# Patient Record
Sex: Female | Born: 1970 | Hispanic: Yes | Marital: Married | Smoking: Never smoker
Health system: Southern US, Community
[De-identification: ages and names within clinical notes are randomized; demographics above are authoritative.]

## PROBLEM LIST (undated history)

## (undated) DIAGNOSIS — E079 Disorder of thyroid, unspecified: Secondary | ICD-10-CM

## (undated) DIAGNOSIS — F32A Depression, unspecified: Secondary | ICD-10-CM

## (undated) DIAGNOSIS — E559 Vitamin D deficiency, unspecified: Secondary | ICD-10-CM

## (undated) DIAGNOSIS — K5900 Constipation, unspecified: Secondary | ICD-10-CM

## (undated) DIAGNOSIS — F329 Major depressive disorder, single episode, unspecified: Secondary | ICD-10-CM

## (undated) DIAGNOSIS — M549 Dorsalgia, unspecified: Secondary | ICD-10-CM

## (undated) DIAGNOSIS — F419 Anxiety disorder, unspecified: Secondary | ICD-10-CM

## (undated) HISTORY — DX: Disorder of thyroid, unspecified: E07.9

## (undated) HISTORY — PX: APPENDECTOMY: SHX54

## (undated) HISTORY — DX: Vitamin D deficiency, unspecified: E55.9

## (undated) HISTORY — PX: TUBAL LIGATION: SHX77

## (undated) HISTORY — DX: Constipation, unspecified: K59.00

## (undated) HISTORY — DX: Anxiety disorder, unspecified: F41.9

## (undated) HISTORY — DX: Major depressive disorder, single episode, unspecified: F32.9

## (undated) HISTORY — DX: Depression, unspecified: F32.A

## (undated) HISTORY — DX: Dorsalgia, unspecified: M54.9

---

## 2006-08-28 ENCOUNTER — Emergency Department (HOSPITAL_COMMUNITY): Admission: EM | Admit: 2006-08-28 | Discharge: 2006-08-28 | Payer: Self-pay | Admitting: Emergency Medicine

## 2007-09-11 ENCOUNTER — Encounter: Admission: RE | Admit: 2007-09-11 | Discharge: 2007-09-11 | Payer: Self-pay | Admitting: Internal Medicine

## 2007-09-15 ENCOUNTER — Encounter: Admission: RE | Admit: 2007-09-15 | Discharge: 2007-09-15 | Payer: Self-pay | Admitting: Internal Medicine

## 2009-11-07 ENCOUNTER — Other Ambulatory Visit: Admission: RE | Admit: 2009-11-07 | Discharge: 2009-11-07 | Payer: Self-pay | Admitting: Gynecology

## 2009-11-07 ENCOUNTER — Ambulatory Visit: Payer: Self-pay | Admitting: Gynecology

## 2009-11-11 ENCOUNTER — Ambulatory Visit: Payer: Self-pay | Admitting: Gynecology

## 2010-11-11 ENCOUNTER — Encounter: Payer: Self-pay | Admitting: Gynecology

## 2010-11-19 ENCOUNTER — Other Ambulatory Visit: Payer: Self-pay | Admitting: Gynecology

## 2010-11-19 ENCOUNTER — Other Ambulatory Visit (HOSPITAL_COMMUNITY)
Admission: RE | Admit: 2010-11-19 | Discharge: 2010-11-19 | Disposition: A | Discharge status: Home / Self Care | Payer: BC Managed Care – PPO | Source: Ambulatory Visit | Attending: Gynecology | Admitting: Gynecology

## 2010-11-19 ENCOUNTER — Encounter (INDEPENDENT_AMBULATORY_CARE_PROVIDER_SITE_OTHER): Payer: BC Managed Care – PPO | Admitting: Gynecology

## 2010-11-19 DIAGNOSIS — Z124 Encounter for screening for malignant neoplasm of cervix: Secondary | ICD-10-CM | POA: Insufficient documentation

## 2010-11-19 DIAGNOSIS — Z833 Family history of diabetes mellitus: Secondary | ICD-10-CM

## 2010-11-19 DIAGNOSIS — R823 Hemoglobinuria: Secondary | ICD-10-CM

## 2010-11-19 DIAGNOSIS — R635 Abnormal weight gain: Secondary | ICD-10-CM

## 2010-11-19 DIAGNOSIS — Z01419 Encounter for gynecological examination (general) (routine) without abnormal findings: Secondary | ICD-10-CM

## 2011-06-23 ENCOUNTER — Other Ambulatory Visit: Payer: Self-pay | Admitting: Gynecology

## 2011-06-23 DIAGNOSIS — Z1231 Encounter for screening mammogram for malignant neoplasm of breast: Secondary | ICD-10-CM

## 2011-06-30 ENCOUNTER — Ambulatory Visit (HOSPITAL_COMMUNITY)
Admission: RE | Admit: 2011-06-30 | Discharge: 2011-06-30 | Disposition: A | Discharge status: Home / Self Care | Payer: BC Managed Care – PPO | Source: Ambulatory Visit | Attending: Gynecology | Admitting: Gynecology

## 2011-06-30 DIAGNOSIS — Z1231 Encounter for screening mammogram for malignant neoplasm of breast: Secondary | ICD-10-CM | POA: Insufficient documentation

## 2011-08-31 HISTORY — PX: COMBINED AUGMENTATION MAMMAPLASTY AND ABDOMINOPLASTY: SUR291

## 2011-08-31 HISTORY — PX: AUGMENTATION MAMMAPLASTY: SUR837

## 2011-08-31 HISTORY — PX: LIPOSUCTION TRUNK: SUR833

## 2011-11-22 ENCOUNTER — Encounter: Payer: BC Managed Care – PPO | Admitting: Gynecology

## 2011-11-24 ENCOUNTER — Ambulatory Visit (INDEPENDENT_AMBULATORY_CARE_PROVIDER_SITE_OTHER): Payer: BC Managed Care – PPO | Admitting: Gynecology

## 2011-11-24 ENCOUNTER — Encounter: Payer: Self-pay | Admitting: Gynecology

## 2011-11-24 VITALS — BP 122/70

## 2011-11-24 DIAGNOSIS — Z01419 Encounter for gynecological examination (general) (routine) without abnormal findings: Secondary | ICD-10-CM

## 2011-11-24 DIAGNOSIS — R635 Abnormal weight gain: Secondary | ICD-10-CM

## 2011-11-24 DIAGNOSIS — G4700 Insomnia, unspecified: Secondary | ICD-10-CM

## 2011-11-24 LAB — TSH: TSH: 4.523 u[IU]/mL — ABNORMAL HIGH (ref 0.350–4.500)

## 2011-11-24 LAB — GLUCOSE, RANDOM: Glucose, Bld: 93 mg/dL (ref 70–99)

## 2011-11-24 LAB — CHOLESTEROL, TOTAL: Cholesterol: 175 mg/dL (ref 0–200)

## 2011-11-24 MED ORDER — ZOLPIDEM TARTRATE 10 MG PO TABS: 10.0000 mg | ORAL_TABLET | Freq: Every evening | ORAL | Status: DC | PRN

## 2011-11-24 NOTE — Progress Notes (Signed)
Denise Byrd [birth year] [id number]   History:    41 y.o.  for annual exam who was last seen the office in 2011. She has complaining of increased weight she's now weighing 170 whereby she was weighing last year 167. Patient concern about an indurated area on her right breast. Patient December 2012 had breast augmentation and abdominoplasty in [place]. Patient had a normal mammogram October 2012 before the procedure. Patient with prior tubal sterilization procedure. Patient having normal menstrual cycles. Patient complaining of occasional vaginal dryness and decreased libido as well as insomnia. Last year she was referred to a psychiatrist and did not follow the recommendation would like to followup with 1 this year. Past medical history,surgical history, family history and social history were all reviewed and documented in the EPIC chart.  Gynecologic History Patient's last menstrual period was 11/12/2011. Contraception: tubal ligation Last Pap: 2012. Results were: normal Last mammogram: 2012. Results were:normal}  Obstetric History OB History    Grav Para Term Preterm Abortions TAB SAB Ect Mult Living   3 3 3       3      # Outc Date GA Lbr Len/2nd Wgt Sex Del Anes PTL Lv   1 TRM     F SVD  No Yes   2 TRM     M SVD  No Yes   3 TRM     M SVD  No Yes       ROS:  Was performed and pertinent positives and negatives are included in the history.  Exam: chaperone present  BP 122/70  LMP 11/12/2011  There is no height or weight on file to calculate BMI.  General appearance : Well developed well nourished female. No acute distress HEENT: Neck supple, trachea midline, no carotid bruits, no thyroidmegaly Lungs: Clear to auscultation, no rhonchi or wheezes, or rib retractions  Heart: Regular rate and rhythm, no murmurs or gallops Breast:Examined in sitting and supine position evidence on both breasts of augmentation scars present. There was no supraclavicular or axillary lymphadenopathy  on either breast. On the right breast and the areolar tissue itself there was an indurated area extending from the 9:00 to the 3:00 position.   Abdomen: no palpable masses or tenderness, no rebound or guarding Extremities: no edema or skin discoloration or tenderness  Pelvic:  Bartholin, Urethra, Skene Glands: Within normal limits             Vagina: No gross lesions or discharge  Cervix: No gross lesions or discharge  Uterus  anteverted, normal size, shape and consistency, non-tender and mobile  Adnexa  Without masses or tenderness  Anus and perineum  normal   Rectovaginal  normal sphincter tone without palpated masses or tenderness             Hemoccult not done     Assessment/Plan:  41 y.o. female for annual exam with an indurated area on the right breast areolar tissue from the 9:00 to the 3:00 position for which she will be sent for diagnostic mammogram. Patient will be given the name and number of a psychiatrist in the community for her to followup with personal issues anxiety depression that she is experiencing. To help with her sleep she was prescribed Ambien to take 1 by mouth each bedtime when necessary the following labs will be ordered today: Random blood sugar, TSH, CBC, and urinalysis. New Pap smear screening guidelines discussed. No Pap smear will be done today since she had a normal 1 2012.  Ok Edwards MD, 11:01 AM 11/24/2011

## 2011-11-24 NOTE — Patient Instructions (Signed)
Te llamaremos esta semana con cita para mamografia

## 2011-11-25 ENCOUNTER — Telehealth: Payer: Self-pay | Admitting: *Deleted

## 2011-11-25 ENCOUNTER — Other Ambulatory Visit: Payer: Self-pay | Admitting: *Deleted

## 2011-11-25 DIAGNOSIS — N6459 Other signs and symptoms in breast: Secondary | ICD-10-CM

## 2011-11-25 LAB — URINALYSIS W MICROSCOPIC + REFLEX CULTURE
Bilirubin Urine: NEGATIVE
Casts: NONE SEEN
Crystals: NONE SEEN
Glucose, UA: NEGATIVE mg/dL
Ketones, ur: NEGATIVE mg/dL
Leukocytes, UA: NEGATIVE
Nitrite: NEGATIVE
Protein, ur: NEGATIVE mg/dL
RBC / HPF: NONE SEEN RBC/hpf (ref <3)
Specific Gravity, Urine: 1.005 (ref 1.005–1.030)
Squamous Epithelial / LPF: NONE SEEN
Urobilinogen, UA: 0.2 mg/dL (ref 0.0–1.0)
WBC, UA: NONE SEEN WBC/hpf (ref <3)
pH: 6 (ref 5.0–8.0)

## 2011-11-25 LAB — CBC WITH DIFFERENTIAL/PLATELET
Basophils Absolute: 0 10*3/uL (ref 0.0–0.1)
Basophils Relative: 0 % (ref 0–1)
Eosinophils Absolute: 0.1 10*3/uL (ref 0.0–0.7)
Eosinophils Relative: 1 % (ref 0–5)
HCT: 42.3 % (ref 36.0–46.0)
Hemoglobin: 14.2 g/dL (ref 12.0–15.0)
Lymphocytes Relative: 22 % (ref 12–46)
Lymphs Abs: 1.6 10*3/uL (ref 0.7–4.0)
MCH: 29.8 pg (ref 26.0–34.0)
MCHC: 33.6 g/dL (ref 30.0–36.0)
MCV: 88.9 fL (ref 78.0–100.0)
Monocytes Absolute: 0.6 10*3/uL (ref 0.1–1.0)
Monocytes Relative: 9 % (ref 3–12)
Neutro Abs: 4.8 10*3/uL (ref 1.7–7.7)
Neutrophils Relative %: 68 % (ref 43–77)
Platelets: 260 10*3/uL (ref 150–400)
RBC: 4.76 MIL/uL (ref 3.87–5.11)
RDW: 13 % (ref 11.5–15.5)
WBC: 7.1 10*3/uL (ref 4.0–10.5)

## 2011-11-25 NOTE — Telephone Encounter (Signed)
Message copied by Mckinley Jewel, Dolores Ewing L on Thu Nov 25, 2011 10:14 AM ------      Message from: Ok Edwards      Created: Wed Nov 24, 2011 11:07 AM       Please schedule diagnostic mammogram with possible ultrasound of right breast on this patient with prior breast augmentation December 2012 who has an indurated area in the areolar tissue itself from the 9:00 to the 3:00 position. Last mammogram at the breast center. Thank you

## 2011-11-25 NOTE — Telephone Encounter (Signed)
Lm for patient to call.  Have appt set at [facility] on 12/01/11 @ 1:20pm.

## 2011-11-29 ENCOUNTER — Telehealth: Payer: Self-pay | Admitting: *Deleted

## 2011-11-29 ENCOUNTER — Other Ambulatory Visit: Payer: Self-pay | Admitting: *Deleted

## 2011-11-29 DIAGNOSIS — R7989 Other specified abnormal findings of blood chemistry: Secondary | ICD-10-CM

## 2011-11-29 NOTE — Telephone Encounter (Signed)
Message copied by Libby Maw on Mon Nov 29, 2011  3:56 PM ------      Message from: Ok Edwards      Created: Thu Nov 25, 2011  8:51 AM       Delenn Ahn, I just want to make sure that this patient was been scheduled for diagnostic mammogram of the right breast. Patient with indurated areolar tissue from the 9 to 3:00 position. Also please inform her that her TSH was slightly elevated I would like to repeat a full thyroid panel next week. Thank you

## 2011-11-29 NOTE — Telephone Encounter (Signed)
Patient informed appt set up with [facility] on 12/01/11 @ 1:20pm.  Also informed patient needs to get TSH panel in the office.  Order in pc and patient will call back to set up lab appt.

## 2011-12-01 ENCOUNTER — Ambulatory Visit
Admission: RE | Admit: 2011-12-01 | Discharge: 2011-12-01 | Disposition: A | Discharge status: Home / Self Care | Payer: BC Managed Care – PPO | Source: Ambulatory Visit | Attending: Gynecology | Admitting: Gynecology

## 2011-12-01 ENCOUNTER — Other Ambulatory Visit: Payer: Self-pay | Admitting: Gynecology

## 2011-12-01 DIAGNOSIS — N6459 Other signs and symptoms in breast: Secondary | ICD-10-CM

## 2011-12-02 ENCOUNTER — Other Ambulatory Visit: Payer: BC Managed Care – PPO

## 2011-12-02 DIAGNOSIS — R7989 Other specified abnormal findings of blood chemistry: Secondary | ICD-10-CM

## 2011-12-03 ENCOUNTER — Other Ambulatory Visit: Payer: Self-pay | Admitting: *Deleted

## 2011-12-03 DIAGNOSIS — R7989 Other specified abnormal findings of blood chemistry: Secondary | ICD-10-CM

## 2011-12-03 LAB — THYROID PANEL WITH TSH
Free Thyroxine Index: 3 (ref 1.0–3.9)
T3 Uptake: 38 % — ABNORMAL HIGH (ref 22.5–37.0)
T4, Total: 7.8 ug/dL (ref 5.0–12.5)
TSH: 2.123 u[IU]/mL (ref 0.350–4.500)

## 2012-06-05 ENCOUNTER — Other Ambulatory Visit: Payer: Self-pay | Admitting: Gynecology

## 2012-06-05 DIAGNOSIS — Z1231 Encounter for screening mammogram for malignant neoplasm of breast: Secondary | ICD-10-CM

## 2012-06-07 ENCOUNTER — Other Ambulatory Visit: Payer: BC Managed Care – PPO

## 2012-06-07 DIAGNOSIS — R7989 Other specified abnormal findings of blood chemistry: Secondary | ICD-10-CM

## 2012-06-08 LAB — THYROID PANEL WITH TSH
Free Thyroxine Index: 3 (ref 1.0–3.9)
T3 Uptake: 36.9 % (ref 22.5–37.0)
T4, Total: 8 ug/dL (ref 5.0–12.5)
TSH: 2.924 u[IU]/mL (ref 0.350–4.500)

## 2012-07-04 ENCOUNTER — Ambulatory Visit
Admission: RE | Admit: 2012-07-04 | Discharge: 2012-07-04 | Disposition: A | Discharge status: Home / Self Care | Payer: BC Managed Care – PPO | Source: Ambulatory Visit | Attending: Gynecology | Admitting: Gynecology

## 2012-07-04 ENCOUNTER — Other Ambulatory Visit: Payer: Self-pay | Admitting: Gynecology

## 2012-07-04 DIAGNOSIS — Z1231 Encounter for screening mammogram for malignant neoplasm of breast: Secondary | ICD-10-CM

## 2012-12-06 ENCOUNTER — Ambulatory Visit (INDEPENDENT_AMBULATORY_CARE_PROVIDER_SITE_OTHER): Payer: BC Managed Care – PPO | Admitting: Gynecology

## 2012-12-06 ENCOUNTER — Encounter: Payer: Self-pay | Admitting: Gynecology

## 2012-12-06 ENCOUNTER — Telehealth: Payer: Self-pay | Admitting: *Deleted

## 2012-12-06 ENCOUNTER — Ambulatory Visit (INDEPENDENT_AMBULATORY_CARE_PROVIDER_SITE_OTHER): Payer: BC Managed Care – PPO

## 2012-12-06 VITALS — BP 116/70 | Ht 64.0 in | Wt 163.0 lb

## 2012-12-06 DIAGNOSIS — Z23 Encounter for immunization: Secondary | ICD-10-CM

## 2012-12-06 DIAGNOSIS — N949 Unspecified condition associated with female genital organs and menstrual cycle: Secondary | ICD-10-CM

## 2012-12-06 DIAGNOSIS — R102 Pelvic and perineal pain: Secondary | ICD-10-CM

## 2012-12-06 DIAGNOSIS — N63 Unspecified lump in unspecified breast: Secondary | ICD-10-CM

## 2012-12-06 DIAGNOSIS — Z01419 Encounter for gynecological examination (general) (routine) without abnormal findings: Secondary | ICD-10-CM

## 2012-12-06 DIAGNOSIS — N831 Corpus luteum cyst of ovary, unspecified side: Secondary | ICD-10-CM

## 2012-12-06 DIAGNOSIS — N83209 Unspecified ovarian cyst, unspecified side: Secondary | ICD-10-CM

## 2012-12-06 DIAGNOSIS — N83202 Unspecified ovarian cyst, left side: Secondary | ICD-10-CM | POA: Insufficient documentation

## 2012-12-06 DIAGNOSIS — N839 Noninflammatory disorder of ovary, fallopian tube and broad ligament, unspecified: Secondary | ICD-10-CM

## 2012-12-06 DIAGNOSIS — R635 Abnormal weight gain: Secondary | ICD-10-CM | POA: Insufficient documentation

## 2012-12-06 DIAGNOSIS — N6310 Unspecified lump in the right breast, unspecified quadrant: Secondary | ICD-10-CM | POA: Insufficient documentation

## 2012-12-06 LAB — CBC WITH DIFFERENTIAL/PLATELET
Basophils Absolute: 0 10*3/uL (ref 0.0–0.1)
Basophils Relative: 0 % (ref 0–1)
Eosinophils Absolute: 0 10*3/uL (ref 0.0–0.7)
Eosinophils Relative: 0 % (ref 0–5)
HCT: 38.9 % (ref 36.0–46.0)
Hemoglobin: 13.5 g/dL (ref 12.0–15.0)
Lymphocytes Relative: 30 % (ref 12–46)
Lymphs Abs: 1.8 10*3/uL (ref 0.7–4.0)
MCH: 30.1 pg (ref 26.0–34.0)
MCHC: 34.7 g/dL (ref 30.0–36.0)
MCV: 86.6 fL (ref 78.0–100.0)
Monocytes Absolute: 0.4 10*3/uL (ref 0.1–1.0)
Monocytes Relative: 6 % (ref 3–12)
Neutro Abs: 3.9 10*3/uL (ref 1.7–7.7)
Neutrophils Relative %: 64 % (ref 43–77)
Platelets: 217 10*3/uL (ref 150–400)
RBC: 4.49 MIL/uL (ref 3.87–5.11)
RDW: 13.4 % (ref 11.5–15.5)
WBC: 6.1 10*3/uL (ref 4.0–10.5)

## 2012-12-06 LAB — HEMOGLOBIN A1C
Hgb A1c MFr Bld: 5.4 % (ref <5.7)
Mean Plasma Glucose: 108 mg/dL (ref <117)

## 2012-12-06 LAB — CHOLESTEROL, TOTAL: Cholesterol: 151 mg/dL (ref 0–200)

## 2012-12-06 LAB — TSH: TSH: 3.128 u[IU]/mL (ref 0.350–4.500)

## 2012-12-06 MED ORDER — OXYCODONE-ACETAMINOPHEN 10-325 MG PO TABS: 1.0000 | ORAL_TABLET | ORAL | Status: DC | PRN

## 2012-12-06 NOTE — Telephone Encounter (Signed)
Orders placed for the below note. 

## 2012-12-06 NOTE — Telephone Encounter (Signed)
Message copied by Aura Camps on Wed Dec 06, 2012  3:43 PM ------      Message from: Ok Edwards      Created: Wed Dec 06, 2012 11:32 AM       Victorino Dike this patient needs to be scheduled at the breast center for a diagnostic mammogram of the right breast with possible ultrasound and screening mammogram of right breast.            Patient with prior breast augmentation 1 year ago in [place] (saline implants). Now with a 2-3 cm irregular mass at the 12 o,clock position periareolar region of right breast. No axillary or supraclavicular lymphadenopathy. ------

## 2012-12-06 NOTE — Progress Notes (Signed)
Denise Byrd Sep 11, 1971 [id number]   History:    42 y.o.  for annual gyn exam presented to the office today complaining of right lower quadrant discomfort on and off for past week. She stated last night her pain was so intense that she'll most went to the emergency room. Patient with prior tubal sterilization procedure. Patient is having normal menstrual cycle. Last menstrual cycle February 1-4 and was normal. Patient with prior history of breast augmentation and abdominoplasty. Patient has complained at times on and off of constipation when she strained recently and wiped she gets all a slight streak of blood. Review of patient's records indicated that her last mammogram last year she had dense breasts but was normal. Patient mother has history of thyroid cancer. Patient denies any prior history of abnormal Pap smears.  Past medical history,surgical history, family history and social history were all reviewed and documented in the EPIC chart.  Gynecologic History Patient's last menstrual period was 11/11/2012. Contraception: tubal ligation Last Pap: 2012. Results were: normal Last mammogram: 2013. Results were: normal  Obstetric History OB History   Grav Para Term Preterm Abortions TAB SAB Ect Mult Living   3 3 3       3      # Outc Date GA Lbr Len/2nd Wgt Sex Del Anes PTL Lv   1 TRM     F SVD  No Yes   2 TRM     M SVD  No Yes   3 TRM     M SVD  No Yes       ROS: A ROS was performed and pertinent positives and negatives are included in the history.  GENERAL: No fevers or chills. HEENT: No change in vision, no earache, sore throat or sinus congestion. NECK: No pain or stiffness. CARDIOVASCULAR: No chest pain or pressure. No palpitations. PULMONARY: No shortness of breath, cough or wheeze. GASTROINTESTINAL: No abdominal pain, nausea, vomiting or diarrhea, melena or bright red blood per rectum. GENITOURINARY: No urinary frequency, urgency, hesitancy or dysuria. MUSCULOSKELETAL: No joint or  muscle pain, no back pain, no recent trauma. DERMATOLOGIC: No rash, no itching, no lesions. ENDOCRINE: No polyuria, polydipsia, no heat or cold intolerance. No recent change in weight. HEMATOLOGICAL: No anemia or easy bruising or bleeding. NEUROLOGIC: No headache, seizures, numbness, tingling or weakness. PSYCHIATRIC: No depression, no loss of interest in normal activity or change in sleep pattern.     Exam: chaperone present  BP 116/70  Ht 5\' 4"  (1.626 m)  Wt 163 lb (73.936 kg)  BMI 27.97 kg/m2  LMP 11/11/2012  Body mass index is 27.97 kg/(m^2).  General appearance : Well developed well nourished female. No acute distress HEENT: Neck supple, trachea midline, no carotid bruits, no thyroidmegaly Lungs: Clear to auscultation, no rhonchi or wheezes, or rib retractions  Heart: Regular rate and rhythm, no murmurs or gallops Breast exam:a 2-3 cm irregular mass at the 12 o,clock position periareolar region of right breast. No axillary or supraclavicular lymphadenopathy. Extremities: no edema or skin discoloration or tenderness  Pelvic:  Bartholin, Urethra, Skene Glands: Within normal limits             Vagina: No gross lesions or discharge  Cervix: No gross lesions or discharge  Uterus  anteverted, normal size, shape and consistency, non-tender and mobile  Adnexa  Without masses or tenderness  Anus and perineum  normal   Rectovaginal  normal sphincter tone without palpated masses or tenderness  Hemoccult not indicated   Ultrasound today: Uterus measures 9.1 x 5.5 x 4.2 cm with endometrial stripe of 9.3 mm. Right ovary was normal. Left ovary thin-walled cyst with positive color flow in the periphery. Internal low level echoes were noted. Cyst measured 2.9 x 1.7 x 1.6 and meters and no fluid in the cul-de-sac.   Assessment/Plan:  42 y.o. female for annual exam with incidental finding of a 2-3 cm irregular mass at the 12 o,clock position periareolar region of right breast. No  axillary or supraclavicular lymphadenopathy. Patient with prior history of breast implants  one year ago in [place]. Patient will be scheduled for diagnostic mammogram and ultrasound of the right breast with a screening mammogram of the left breast. The following labs were also order: CBC, hemoglobin A1c, TSH, and urinalysis. No Pap smear done today the new screening guidelines discussed. Patient with a small apparent hemorrhagic cyst for which will prescribe Percocet to take one by mouth every 4-6 hours when necessary. She will return to the office in 3 months for followup ultrasound. We will hold off on any hormone manipulation of the cyst until we have complete evaluation of her right breast mass.    Ok Edwards MD, 12:38 PM 12/06/2012

## 2012-12-06 NOTE — Patient Instructions (Addendum)
Vacuna difteria/ttanos (Td) o [place] difteria, ttanos, tos convulsa (Tdap), Lo que debe saber (Tetanus, Diphtheria [Td] or Tetanus, Diphtheria, Pertussis [Tdap] Vaccine, What You Need to Know) PORQU VACUNARSE? El ttanos , la difteria y la tos ferina pueden ser enfermedades graves.  El TTANOS  (trismo) provoca la contraccin dolorosa y rigidez de los msculos, por lo general, en todo el cuerpo.   Puede causar la contraccin de los msculos de la cabeza y el cuello de modo que el enfermo no puede abrir la boca ni tragar., y en algunos casos, tampoco puede respirar.. El ttanos causa la muerte de 1 de cada 5 personas que se infectan. LA DIFTERIA produce la formacin de una membrana gruesa que cubre el fondo de la garganta.  Puede causar problemas respiratorios, parlisis, insuficiencia cardaca, e incluso la muerte. El PERTUSIS (tos [place]) causa ataques de tos intensa que pueden dificultar la respiracin, provocar vmitos e interrumpir el sueo.   Puede causar prdida de peso, incontinencia, fractura de [place], y desmayos por la intensa tos. Hasta de 2 de cada 100 adolescentes y 5 de cada 100 adultos que enferman de tos [place] deben ser hospitalizados o tienen complicaciones como la neumona y la [place]. Estas 3 enfermedades son provocadas por bacterias. La difteria y la tos Benetta Spar se [place] de persona a Social worker. El ttanos ingresa al organismo a travs de cortes, rasguos o heridas. En los [place] ocurran alrededor de 200 000 casos por ao de difteria y tos [place], antes de que existieran las [place], y tambin ocurran cientos de casos de ttanos. Desde la aparicin de las vacunas, el ttanos y la difteria han disminuido en alrededor del 99% y los casos de tos ferina disminuyeron aproximadamente el 92%.  Los nios menores de 6 aos deben recibir la vacuna DTaP para estar protegidos contra estas tres enfermedades. Pero los [employer], los adolescentes y los adultos tambin  necesitan proteccin. VACUNAS PARA ADOLESCENTES Y ADULTOS Vacunas Tdap y Td  Hay dos vacunas disponibles para proteger de estas enfermedades a nios a Glass blower/designer de los 7aos:   La vacuna Td fue utilizada durante muchos aos. Protege contra el ttanos y la difteria.  La vacuna Tdap fue autorizada en 2005. Es la primera vacuna para adolescentes y adultos que protege contra la tos ferina y el ttanos y la difteria. Una dosis de refuerzo de la Td se recomienda cada 10 aos. La Tdap se aplica slo una vez.  QU VACUNA DEBO APLICARME Y CUANDO? Las edades de 7 a 18 aos  Dynegy 11 y los 12 aos se recomienda una dosis de Tdap. Esta dosis puede aplicarse desde los 7 aos en los nios que no han recibido una o ms dosis de DTaP anteriormente.  Los nios y adolescentes que no recibieron todas las dosis programadas de DTaP o DTP a los 7 aos deben completar la serie usando una combinacin de Td y Tdap. Adultos de 19 aos o ms  [employer] adultos deben recibir una dosis de refuerzo de Td cada 10 aos. Los adultos de menos de 65 aos que nunca hayan recibido la Tdap deben reemplazarla por la siguiente dosis de refuerzo. Los adultos a partir de los 65 aos puedenrecibir una dosis de Tdap.  Los adultos (incluyendo las mujeres que podran quedar embarazadas y los adultos mayores de 65 aos) que tienen contacto cercano con un beb menor de 12 meses deben aplicarse una dosis de Tdap para proteger al beb de la tos [place].  Los trabajadores de  la salud que tengan contacto directo con pacientes en hospitales o clnicas deben recibir una dosis de Tdap. Proteccin despus de [place] herida  Es posible que una persona que tenga un corte o quemadura grave necesite una dosis de Td o Tdap para prevenir la infeccin por ttanos. Puede usarse la Tdap en personas que nunca recibieron una dosis. Pero debe usarse la Td, si la Tdap no se encuentra disponible, o para:  Cualquier persona que haya recibido una dosis de  Tdap.  Los nios [employer] 7 y los 9 aos que han C.H. Robinson Worldwide series de DTap anteriormente.  Adultos de 65 aos o ms. Mujeres embarazadas.   Las mujeres embarazadas que nunca recibieron una dosis de Ddap deben recibirla despus de la 20a semana de gestacin y preferiblemente durante Contractor. trimestre. Si no se aplican la Tdap durante el embarazo, deben recibirla lo antes posible despus del parto. Las mujeres embarazadas que han recibido la Tdap y tienen que aplicarse la vacuna contra el ttanos o la difteria durante el [place], deben recibir la Td. Las vacunas Tdap y Td pueden ser administradas al mismo tiempo que otras vacunas. ALGUNAS PERSONAS NO DEBEN RECIBIR LA VACUNA O DEBEN [employer]ard  Las personas que hayan tenido una reaccin alrgica que haya puesto en peligro su vida despus de una dosis de vacuna contra el ttanos, la difteria o la tos ferina no deben recibir Td ni Tdap..  Las personas que tengan alergias graves a algn componente de una vacuna no deben recibir esa vacuna. Informe a su mdico si la persona que recibe la vacuna sufre alergias graves.  Cualquier persona que American Standard Companies en coma o que haya tenido convulsiones dentro de los 7 [address]  [address] posteriores despus de una dosis de DTP o DTaP no debe recibir la Tdap, salvo que se encuentre una causa que no fuera la vacuna. Estas personas pueden recibir Td.  Consulte a su mdico si la persona que recibe [place] de las vacunas:  Tiene epilepsia o algn otro problema del sistema nervioso.  Tuvo inflamacin o dolor intenso despus de una dosis de DTP, DTaP, DT, Td, o Tdap.  Ha tenido el sndrome de Scientific laboratory technician (GBS por sus siglas en ingls). Las personas que sufran una enfermedad moderada o grave el da en que se programa la vacuna, deben esperar a recuperarse para recibir las vacunas Tdap o Td. Por lo general, una persona con una enfermedad leve o fiebre baja puede recibir la vacuna. CULES SON LOS RIESGOS DE LAS VACUNAS TDAP Y  TD? Con Cathleen Corti, al igual que con cualquier Automatic Data, siempre existe un pequeo riesgo de una reaccin alrgica que ponga en peligro la vida o cause otro problema grave. Todo procedimiento mdico, inclusive la vacunacin pueden causar breves episodios de lipotimia o sntomas relacionados (como movimientos espasmdicos). Para evitar los [employer] y las lesiones causadas por las cadas, permanezca sentado o recustese durante los 15 minutos posteriores a la vacunacin. Informe a su mdico si el paciente se siente dbil o mareado, tiene cambios en la visin o siente zumbidos en los odos.  Es mucho ms probable que tener ttanos, difteria, o tos ferina cause problemas ms graves que los provocados por recibir cualquiera de las vacunas Td o Tdap. A continuacin se enumeran los problemas informados despus de las vacunas Td y Tdap. Problemas Leves (perceptibles, pero que no interfirieron con las actividades): Tdap  Dolor (alrededor de 3 de cada 4 adolescentes y 2 de cada 3 adultos).  Enrojecimiento o inflamacin en  el sitio de la inyeccin (alrededor de 1 de cada 5).  Fiebre leve de al menos 100.4 F (38 C) (hasta alrededor de 1 cada 25 adolescentes y 1 de cada 100 adultos).  Dolor de cabeza (alrededor de 4 de cada 10 adolescentes y 3 de cada 10 adultos).  Cansancio (alrededor de 1 de cada 3 adolescentes y 1 de cada 4 adultos).  Nuseas, vmitos, diarrea, o dolor de estmago (hasta 1 de cada 4 adolescentes y 1 de cada 10 adultos).  Escalofros, dolores corporales, dolor articular, erupciones, o inflamacin de las glndulas (poco frecuente). Td  Dolor (hasta alrededor de 8 de cada 10).  Enrojecimiento o inflamacin de la inyeccin (alrededor de 1 de cada 3).  Fiebre leve (hasta alrededor de 1 de cada 5).  Dolor de cabeza o cansancio (poco frecuente). Problemas Moderados (interfieren con las [place], [place] no requieren atencin mdica): Tdap  Dolor en el sitio de la inyeccin  (alrededor de 1 de cada 20 adolescentes y 1 de cada 100 adultos).  Enrojecimiento o inflamacin de la inyeccin (alrededor de 1 de cada 16 adolescentes y 1 de cada 25 adultos).  Fiebre de ms de 102 F (38.9 C) (alrededor de 1 de cada 100 adolescentes y 1 de cada 250 adultos).  Dolor de cabeza (1 de cada 300).  Nuseas, vmitos, diarrea, o dolor de estmago (hasta 3 de cada 100 adolescentes y 1 de cada 100 adultos). Td  Fiebre de ms de 102 F (38.9 C) (poco comn). Tdap o Td  Inflamacin de gran extensin en el brazo en el que se aplic la vacuna (hasta 3 de cada 100). Problemas [place] (no puede realizar [employer]; requiere Psychologist, prison and probation services) Tdap o Td  Inflamacin, dolor intenso, sangrado y enrojecimiento en el brazo, en el sitio de la inyeccin (poco frecuente). Puede producirse una reaccin alrgica grave despus de cualquier vacuna. Se estima que estas reacciones ocurren en menos de una de cada un milln de dosis. QU PASA SI HAY UNA REACCIN GRAVE? Qu signos debo buscar? Cualquier estado poco habitual, como una reaccin alrgica grave o fiebre alta. Si le produce Runner, broadcasting/film/video grave, se manifestar dentro de algunos minutos a una hora despus de recibir la vacuna. Entre los signos de Automotive engineer grave se encuentran la dificultad para respirar, debilidad, ronquera o sibilancias, latidos cardacos acelerados, urticaria, mareos, palidez, o inflamacin de la garganta. Qu debo hacer?  Comunquese con su mdico o lleve inmediatamente a la persona al mdico.  Dgale a su mdico qu ocurri, la fecha y hora en que sucedi y cundo le aplicaron la vacuna.  Pida a su mdico que informe sobre la reaccin llenando un formulario del Sistema de Informacin de Reacciones Adversos a las Administrator, arts (VAERS, por sus siglas en ingls). O, puede presentar este informe a travs del sitio web de VAERS [email or web address] o puede llamar al [phone]. VAERS no brinda  asistencia mdica. PROGRAMA NACIONAL DE COMPENSACIN DE DAOS POR VACUNAS El [place] de Compensacin de Daos por Vacunas (VICP) fue creado en 1986.  Aquellas personas que consideren que han sufrido un dao como consecuencia de una vacuna y quieren saber ms acerca del programa y como presentar Roslynn Amble, [place] llamar al [phone] o visitar su sitio web en [email or web address]  CMO Roxan Diesel MS INFORMACIN?  El profesional podr darle el prospecto de la vacuna o sugerirle otras fuentes de informacin.  Comunquese con el servicio de salud de su localidad o [address].  Comunquese con los Centros para el  control y la prevencin de Child psychotherapist for Disease Control and Prevention , CDC).  Llame al [phone] (1-800-CDC-INFO).  Visite los sitios web de Energy Transfer Partners en PicCapture.uy CDC Td and Tdap Interim VIS-Spanish (10/06/10) Document Released: 12/16/2008 Document Revised: 11/22/2011 [facility] Patient Information 2013 [place], [place].    Ejercicios para perder peso (Exercise to Lose Weight) La actividad fsica y Neomia Dear dieta saludable ayudan a perder peso. El mdico podr sugerirle ejercicios especficos. IDEAS Y CONSEJOS PARA HACER EJERCICIOS  Elija opciones econmicas que disfrute hacer , como caminar, andar en bicicleta o los vdeos para ejercitarse.   Utilice las Microbiologist del ascensor.   Camine durante la hora del almuerzo.   Estacione el auto lejos del lugar de [place] o [place].   Concurra a un gimnasio o tome clases de gimnasia.   Comience con 5  10 minutos de actividad fsica por da. Ejercite hasta 30 minutos, 4 a 6 das por [address]   Utilice zapatos que tengan un buen soporte y ropas cmodas.   Elongue antes y despus de Company secretary.   Ejercite hasta que aumente la respiracin y el corazn palpite rpido.   Beba agua extra cuando ejercite.   No haga ejercicio Firefighter, sentirse mareado o que le  falte mucho el aire.  La actividad fsica puede quemar alrededor de 150 caloras.  Correr 20 cuadras en 15 minutos.   Jugar vley durante 45 a 60 minutos.   Limpiar y encerar el auto durante 45 a 60 minutos.   Jugar ftbol americano de toque.   Caminar 25 cuadras en 35 minutos.   Empujar un cochecito 20 cuadras en 30 minutos.   Jugar baloncesto durante 30 minutos.   Rastrillar hojas secas durante 30 minutos.   Andar en bicicleta 80 cuadras en 30 minutos.   Caminar 30 cuadras en 30 minutos.   Bailar durante 30 minutos.   Quitar la nieve con una pala durante 15 minutos.   Nadar vigorosamente durante 20 minutos.   Subir escaleras durante 15 minutos.   Andar en bicicleta 60 cuadras durante 15 minutos.   Arreglar el jardn entre 30 y 45 minutos.   Saltar a la soga durante 15 minutos.   Limpiar vidrios o pisos durante 45 a 60 minutos.  Document Released: 12/04/2010 Document Revised: 05/12/2011 [facility] Patient Information 2012 [place], [place].                                                  Control del colesterol  Los niveles de colesterol en el organismo estn determinados significativamente por su dieta. Los niveles de colesterol tambin se relacionan con la enfermedad cardaca. El material que sigue ayuda a Software engineer relacin y a Chiropractor qu puede hacer para mantener su corazn sano. No todo el colesterol es [place]. Las lipoprotenas de baja densidad (LDL) forman el colesterol "malo". El colesterol malo puede ocasionar depsitos de grasa que se acumulan en el interior de las arterias. Las lipoprotenas de alta densidad (HDL) es el colesterol "bueno". Ayuda a remover el colesterol LDL "malo" de la [place]. El colesterol es un factor de riesgo muy importante para la enfermedad cardaca. Otros factores de riesgo son la hipertensin arterial, el hbito de fumar, el estrs, la herencia y [place].   El msculo cardaco obtiene el suministro de sangre a travs de las arterias  coronarias. Si  su colesterol LDL ("malo") est elevado y el HDL ("bueno") es bajo, tiene un factor de riesgo para que se formen depsitos de Holiday representative en las arterias coronarias (los vasos sanguneos que suministran sangre al corazn). Esto hace que haya menos lugar para que la sangre circule. Sin la suficiente sangre y oxgeno, el msculo cardaco no puede funcionar correctamente, y usted podr sentir dolores en el pecho (angina pectoris). Cuando una arteria coronaria se cierra completamente, una parte del msculo cardaco puede morir (infarto de miocardio).  CONTROL DEL COLESTEROL Cuando el profesional que lo asiste enva la sangre al laboratorio para Artist nivel de colesterol, puede realizarle tambin un perfil completo de los lpidos. Con esta prueba, se puede determinar la cantidad total de colesterol, as como los niveles de LDL y HDL. Los triglicridos son un tipo de grasa que circula en la sangre y que tambin puede utilizarse para determinar el riesgo de enfermedad cardaca. En la siguiente tabla se establecen los nmeros ideales: Prueba: Colesterol total  Menos de 200 mg/dl.  Prueba: LDL "colesterol malo"  Menos de 100 mg/dl.   Menos de 70 mg/dl si tiene riesgo muy elevado de sufrir un ataque cardaco o muerte cardaca sbita.  Prueba: HDL "colesterol bueno"  Mujeres: Ms de 50 mg/dl.   Hombres: Ms de 40 mg/dl.  Prueba: Trigliceridos  Menos de 150 mg/dl.    CONTROL DEL COLESTEROL CON DIETA Aunque factores como el ejercicio y el estilo de vida son importantes, la "primera lnea de ataque" es la dieta. Esto se debe a que se sabe que ciertos alimentos hacen subir el colesterol y otros lo [place]. El objetivo debe ser [employer] Foods alimentos, de modo que tengan un efecto sobre el colesterol y, an ms importante, Microbiologist las grasas saturadas y trans con otros tipos de grasas, como las monoinsaturadas y las poliinsaturadas y cidos grasos omega-3 . En promedio, una persona no debe  consumir ms de 15 a 17 g de grasas saturadas por C.H. [employer] Worldwide. Las grasas saturadas y trans se consideran grasas "malas", ya que elevan el colesterol LDL. Las grasas saturadas se encuentran principalmente en productos animales como carne, Akins y crema. Pero esto no significa que usted Marketing executive todas sus comidas favoritas. Actualmente, como lo muestra el cuadro que figura al final de este documento, hay sustitutos de buen sabor, bajos en grasas y en colesterol, para la mayora de los alimentos que a usted Musician. Elija aquellos alimentos alternativos que sean bajos en grasas o sin grasas. Elija cortes de carne del cuarto trasero o lomo ya que estos cortes son los que tienen menor cantidad de grasa y Oncologist. El pollo (sin piel), el pescado, la carne de ternera, y la [place] molida son excelentes opciones. Elimine las carnes Tyson Foods o el salami. Los Federal[employer]Mogul o nada de grasas saturadas. Cuando consuma carne [place], carne de aves de corral, o pescado, hgalo en porciones de 85 gramos (3 onzas). Las grasas trans tambin se llaman "aceites parcialmente hidrogenados". Son aceites manipulados cientficamente de [place] que son slidos a Publishing rights manager, tienen una larga vida y Glass blower/designer sabor y la textura de los alimentos a los que se Scientist, clinical (histocompatibility and immunogenetics). Las grasas trans se encuentran en la [place], [place], crackers y alimentos horneados.  Para hornear y cocinar, el aceite es un excelente sustituto para la [place]. Los aceites monoinsaturados tienen un beneficio particular, ya que se cree que disminuyen el colesterol LDL (colesterol malo) y elevan el HDL. Deber evitar  los aceites tropicales saturados como el de coco y el de [place].  Recuerde, adems, que puede comer sin restricciones los grupos de alimentos que son naturalmente libres de grasas saturadas y Neurosurgeon trans, entre los que se incluyen el pescado, las frutas (excepto el [place]), verduras, frijoles, cereales  (cebada, arroz, [place], trigo) y las pastas (sin salsas con crema)   IDENTIFIQUE LOS ALIMENTOS QUE DISMINUYEN EL COLESTEROL  Pueden disminuir el colesterol las fibras solubles que estn en las frutas, como las [place], en los vegetales como el brcoli, las patatas y las zanahorias; en las legumbres como frijoles, guisantes y Therapist, occupational; y en los cereales como la cebada. Los alimentos fortificados con fitosteroles tambin Engineer, production. Debe consumir al menos 2 g de estos alimentos a diario para Financial planner de disminucin de [place].  En el supermercado, lea las etiquetas de los envases para identificar los alimentos bajos en grasas saturadas, libres de grasas trans y bajos en [place], . Elija quesos que tengan solo de 2 a 3 g de grasa saturada por onza (28,35 g). Use una margarina que no dae el corazn, [place] de grasas trans o aceite parcialmente hidrogenado. Al comprar alimentos horneados (galletitas dulces y Gaffer) evite el aceite parcialmente hidrogenado. Los panes y bollos debern ser de granos enteros (harina de maz o de avena entera, en lugar de "harina" o "harina enriquecida"). Compre sopas en lata que no sean cremosas, con bajo contenido de sal y sin grasas adicionadas.   TCNICAS DE PREPARACIN DE LOS ALIMENTOS  Nunca fra los alimentos en aceite abundante. Si debe frer, hgalo en poco aceite y removiendo [place], porque as se utilizan muy pocas grasas, o utilice un spray antiadherente. Cuando le sea posible, hierva, hornee o ase las carnes y cocine los vegetales al vapor. En vez de Aetna con mantequilla o [place], utilice limn y hierbas, pur de Psychologist, educational y canela (para las calabazas y batatas), yogurt y salsa descremados y aderezos para ensaladas bajos en contenido graso.   BAJO EN GRASAS SATURADAS / SUSTITUTOS BAJOS EN GRASA  Carnes / Grasas saturadas (g)  Evite: Bife, corte graso (3 oz/85 g) / 11 g   Elija: Bife, corte magro (3 oz/85  g) / 4 g   Evite: Hamburguesa (3 oz/85 g) / 7 g   Elija:  [place]a magra (3 oz/85 g) / 5 g   Evite: Jamn (3 oz/85 g) / 6 g   Elija:  Jamn magro (3 oz/85 g) / 2.4 g   Evite: Pollo, con piel (3 oz/85 g), Carne oscura / 4 g   Elija:  Pollo, sin piel (3 oz/85 g), Carne oscura / 2 g   Evite: Pollo, con piel (3 oz/85 g), Carne magra / 2.5 g   Elija: Pollo, sin piel (3 oz/85 g), Carne magra / 1 g  Lcteos / Grasas saturadas (g)  Evite: Leche entera (1 taza) / 5 g   Elija: Leche con bajo contenido de grasa, 2% (1 taza) / 3 g   Elija: Leche con bajo contenido de grasa, 1% (1 taza) / 1.5 g   Elija: Leche descremada (1 taza) / 0.3 g   Evite: Queso duro (1 oz/28 g) / 6 g   Elija: Queso descremado (1 oz/28 g) / 2-3 g   Evite: Queso cottage, 4% grasa (1 taza)/ 6.5 g   Elija: Queso cottage con bajo contenido de grasa, 1% grasa (1 taza)/ 1.5 g   Evite: Helado (1 taza) / 9 g  Elija: Sorbete (1 taza) / 2.5 g   Elija: Yogurt helado sin contenido de grasa (1 taza) / 0.3 g   Elija: Barras de fruta congeladas / vestigios   Evite: Crema batida (1 cucharada) / 3.5 g   Elija: Batidos glac sin lcteos (1 cucharada) / 1 g  Condimentos / Grasas saturadas (g)  Evite: Mayonesa (1 cucharada) / 2 g   Elija: Mayonesa con bajo contenido de grasa (1 cucharada) / 1 g   Evite: [place] (1 cucharada) / 7 g   Elija: Margarina extra light (1 cucharada) / 1 g   Evite: Aceite de coco (1 cucharada) / 11.8 g   Elija: Aceite de oliva (1 cucharada) / 1.8 g   Elija: Aceite de maz (1 cucharada) / 1.7 g   Elija: Aceite de crtamo (1 cucharada) / 1.2 g   Elija: Aceite de girasol (1 cucharada) / 1.4 g   Elija: Aceite de soja (1 cucharada) / 2.4 g   Elija: Aceite de canola (1 cucharada) / 1 g  Document Released: 08/30/2005 Document Revised: 05/12/2011 [facility] Patient Information 2012 [place], [place].  Quiste ovrico (Ovarian Cyst) Los ovarios son pequeos rganos que se encuentran a  cada lado del tero. Los ovarios son los rganos que producen las hormonas femeninas, estrgeno y Education officer, museum. Un quiste en el ovario es una bolsa llena de lquido que puede variar en tamao. Es normal que se formen pequeos quistes en las mujeres en edad de procrear y que an tienen sus perodos Designer, jewellery. Este tipo de quiste se denomina quiste folicular que se transforma en un quiste ovulatorio (quiste del cuerpo lteo) despus de producir los vulos. Si la mujer no queda embarazada, desaparece sin ninguna intervencin. Existen otros tipos de quistes de ovario que pueden causar problemas y necesitan ser tratados. El problema ms grave es que el quiste sea canceroso. Debe advertirse que en las mujeres menopusicas que presentan un quiste de ovario, existe un mayor riesgo de que ese quiste sea canceroso. Deben evaluarse muy rpida y [place], y IT sales professional. Esto es ms importante en las mujeres menopusicas debido al elevado porcentaje de cncer de ovario durante este perodo. CAUSAS Y TIPOS DE CNCER DE OVARIO:  QUISTE FUNCIONAL: El quiste de folculo o cuerpo lteo es un quiste funcional que aparece todos los meses durante la ovulacin, con el ciclo menstrual. Si la mujer no queda embarazada, desaparecen con el prximo ciclo menstrual. Generalmente los quistes funcionales no presentan sntomas.  ENDOMETRIOMA: este quiste aparece en la superficie del tejido del tero. Un quiste se forma en el interior o [employer]. Cada mes se desarrolla un poco ms debido a la sangre del perodo menstrual. Tambin se denomina "quiste de chocolate" debido a que est lleno de sangre que se vuelve color marrn. Este tipo de quiste causa dolor en la zona inferior del abdomen durante las relaciones sexuales y durante el perodo menstrual.  CISTADENOMA: Se desarrolla a partir de las clulas externas del ovario. Generalmente no son cancerosos. Pueden llegar a ser de gran tamao y causar dolor en la zona  baja del abdomen y [employer] sexuales. Este tipo de quiste puede retorcerse e interrumpir el flujo de Export, lo que causa un dolor muy intenso. Tambin puede romperse y Horticulturist, commercial.  QUISTE DERMOIDE: generalmente este tipo de quiste aparece en ambos ovarios. Puede haber diferentes tipos de tejidos en el quiste. Por ejemplo tejidos de piel, dientes, pelos o cartlago. En general no dan sntomas, excepto que sean muy  grandes. Los quistes dermoides rara vez son cancerosos.  OVARIO POLIQUSTICO: es una enfermedad rara relacionada con trastornos hormonales que produce muchos quistes pequeos en ambos ovarios. Estos quistes son similares a los quistes de folculo pero nunca producen vulos y se transforman en cuerpo lteo. Pueden causar aumento del [employer], infertilidad, acn, aumento del vello facial y corporal y falta de perodos menstruales o perodos anormales. Muchas mujeres que sufren este problema presentan diabetes tipo 2. La causa exacta de este problema es desconocida. Un ovario poliqustico rara vez es canceroso.  QUISTE OVRICO TECALUTESTICO Aparece cuando hay demasiada hormona (gonadotrofina corinica humana), la que sobreestimula al ovario para producir vulos. Se observan con frecuencia cuando el mdico estimula los ovarios para la fertilizacin in vitro (bebs de probeta).  QUISTE LUTENICO: Aparece durante el embarazo. En algunos casos raros, produce una obstruccin del canal de parto. Generalmente desaparece despus del parto. SNTOMAS  Dolor o molestias en la pelvis.  Dolor durante las [employer].  Aumento de la inflamacin en el abdomen.  Perodos menstruales anormales.  Aumento del [employer] perodos [place].  Deja de menstruar y no est embarazada. DIAGNSTICO El diagnstico puede realizarse durante:  Los exmenes plvicos anuales o de rutina (frecuente).  Ecografas  Radiografas de la pelvis.  Tomografa  computada  Resonancia magntica..  Anlisis de sangre. TRATAMIENTO  El tratamiento slo consiste en que el mdico controle el quiste Rena Lara, durante 2  3 meses. Muchos desaparecen espontnemente, especialmente los quistes funcionales.  Puede aspirarse (secarse) con Marella Bile larga observndolo en una ecografa, o por laparoscopa (insertando un tubo en la pelvis a travs de una pequea incisin).  El quiste puede extirparse con laparoscopa.  En algunos casos es necesario extirparlo a travs de una incisin en la zona inferior del abdomen.  El tratamiento hormonal se utiliza para Restaurant manager, fast food ciertos tipos de [place].  Las pldoras anticonceptivas pueden utilizarse para Restaurant manager, fast food otros tipos. INSTRUCCIONES PARA EL CUIDADO DOMICILIARIO Siga las indicaciones del profesional con respecto a:  Medicamentos  Visitas de control para evaluar y Pharmacologist.  Puede ser necesario que tenga que volver o concertar una cita con otro profesional para descubrir la causa exacta del quiste, si su mdico no es Research scientist (physical sciences).  Realice un examen plvico y un Papanicolau todos los aos, segn las indicaciones.  Informe al mdico si tubo un quiste de ovario en el pasado. SOLICITE ATENCIN MDICA SI:  Los perodos se atrasan, son irregulares, le faltan o son dolorosos.  El dolor abdominal (en el vientre) o en la pelvis persisten.  El abdomen se agranda o se hincha.  Siente una opresin en la vejiga o tiene problemas para vaciarla completamente.  Tiene dolor durante las [employer].  Tiene la sensacin de hinchazn, presin o molestias en el abdomen.  Pierde peso sin razn aparente.  Siente un Engineer, maintenance (IT).  Est constipada.  Pierde el apetito.  Aparece acn.  Aumenta el vello facial y Personal assistant.  Lenora Boys de peso sin hacer modificaciones en su actividad fsica y en su dieta habitual.  Sospecha que est embarazada. SOLICITE ATENCIN MDICA DE INMEDIATO  SI:  Siente dolor abdominal cada vez ms intenso.  Si tiene ganas de vomitar (nuseas).  Le sube repentinamente la fiebre.  Siente dolor abdominal al mover el intestino.  Sus perodos menstruales son ms abundantes que lo habitual. Document Released: 06/09/2005 Document Revised: 11/22/2011 [facility] Patient Information 2013 [place], [place].

## 2012-12-07 LAB — URINALYSIS W MICROSCOPIC + REFLEX CULTURE
Bacteria, UA: NONE SEEN
Bilirubin Urine: NEGATIVE
Casts: NONE SEEN
Crystals: NONE SEEN
Glucose, UA: NEGATIVE mg/dL
Hgb urine dipstick: NEGATIVE
Ketones, ur: NEGATIVE mg/dL
Leukocytes, UA: NEGATIVE
Nitrite: NEGATIVE
Protein, ur: NEGATIVE mg/dL
Specific Gravity, Urine: 1.008 (ref 1.005–1.030)
Squamous Epithelial / LPF: NONE SEEN
Urobilinogen, UA: 0.2 mg/dL (ref 0.0–1.0)
pH: 6.5 (ref 5.0–8.0)

## 2012-12-12 ENCOUNTER — Telehealth: Payer: Self-pay | Admitting: *Deleted

## 2012-12-12 NOTE — Telephone Encounter (Signed)
Message copied by Aura Camps on Tue Dec 12, 2012  4:02 PM ------      Message from: Ok Edwards      Created: Wed Dec 06, 2012 11:32 AM       Victorino Dike this patient needs to be scheduled at the breast center for a diagnostic mammogram of the right breast with possible ultrasound and screening mammogram of right breast.            Patient with prior breast augmentation 1 year ago in [place] (saline implants). Now with a 2-3 cm irregular mass at the 12 o,clock position periareolar region of right breast. No axillary or supraclavicular lymphadenopathy. ------

## 2012-12-12 NOTE — Telephone Encounter (Signed)
appt. 12/22/12 @ 8:20 am

## 2012-12-22 ENCOUNTER — Ambulatory Visit
Admission: RE | Admit: 2012-12-22 | Discharge: 2012-12-22 | Disposition: A | Discharge status: Home / Self Care | Payer: BC Managed Care – PPO | Source: Ambulatory Visit | Attending: Gynecology | Admitting: Gynecology

## 2012-12-22 DIAGNOSIS — N63 Unspecified lump in unspecified breast: Secondary | ICD-10-CM

## 2013-01-11 ENCOUNTER — Encounter: Payer: Self-pay | Admitting: Gynecology

## 2013-01-11 ENCOUNTER — Ambulatory Visit (INDEPENDENT_AMBULATORY_CARE_PROVIDER_SITE_OTHER): Payer: BC Managed Care – PPO | Admitting: Gynecology

## 2013-01-11 VITALS — BP 110/68

## 2013-01-11 DIAGNOSIS — R102 Pelvic and perineal pain: Secondary | ICD-10-CM

## 2013-01-11 DIAGNOSIS — N940 Mittelschmerz: Secondary | ICD-10-CM

## 2013-01-11 DIAGNOSIS — N949 Unspecified condition associated with female genital organs and menstrual cycle: Secondary | ICD-10-CM

## 2013-01-11 LAB — URINALYSIS W MICROSCOPIC + REFLEX CULTURE
Bilirubin Urine: NEGATIVE
Casts: NONE SEEN
Crystals: NONE SEEN
Glucose, UA: NEGATIVE mg/dL
Ketones, ur: NEGATIVE mg/dL
Leukocytes, UA: NEGATIVE
Nitrite: NEGATIVE
Specific Gravity, Urine: 1.02 (ref 1.005–1.030)
Urobilinogen, UA: 2 mg/dL — ABNORMAL HIGH (ref 0.0–1.0)
WBC, UA: NONE SEEN WBC/hpf (ref <3)
pH: 7 (ref 5.0–8.0)

## 2013-01-11 MED ORDER — KETOROLAC TROMETHAMINE 10 MG PO TABS: 10.0000 mg | ORAL_TABLET | Freq: Four times a day (QID) | ORAL | Status: DC | PRN

## 2013-01-11 MED ORDER — KETOROLAC TROMETHAMINE 30 MG/ML IJ SOLN
30.0000 mg | Freq: Once | INTRAMUSCULAR | Status: AC
  Administered 2013-01-11: 30 mg via INTRAMUSCULAR

## 2013-01-11 NOTE — Progress Notes (Addendum)
Patient presented to the office today complaining today history of left lower quadrant pain. She stated that on and off she's had some dyspareunia and not  more frequently. Her cycles were typically regular. She's had a previous tubal sterilization procedure.  Review of her record indicated she was seen in the office on March 26 had been complaining of right lower quadrant discomfort near the start of her menses just like in the cycle and had an ultrasound with the following result:  Uterus measures 9.1 x 5.5 x 4.2 cm with endometrial stripe of 9.3 mm. Right ovary was normal. Left ovary thin-walled cyst with positive color flow in the periphery. Internal low level echoes were noted. Cyst measured 2.9 x 1.7 x 1.6 and meters and no fluid in the cul-de-sac.   It was thought that it was a hemorrhagic cyst and she was instructed to return back in 3 months for followup ultrasound to return today with discomfort in the contralateral side.  Exam: Bartholin urethra Skene was within normal limits Vagina: Menstrual blood present (day 2 of patient's menstrual cycle) cervix: No lesions or discharge Uterus: Anteverted normal size shape and consistency Right adnexa no palpable mass or tenderness Left adnexa tenderness although I cannot discern a typical mass there was some mild guarding. Rectal exam: Not done  Assessment/plan: Patient with apparent mittelschmerz based on her cyclical and timing of her pain now on opposite side that in March. She did have a small ovarian cyst on the left. She will return back to the office next week after her menses and will followup with an ultrasound. She was given Toradol 30 mg IM today and will be prescribed Toradol 10 mg by mouth every 6 hours when necessary for the next 5 days. We discussed that after the ultrasound she may be a good candidate for low dose oral contraceptive pill for her dysmenorrhea and for ovarian cyst suppression. Patient stated before she had her tubal  sterilization procedure she had been on the oral contraceptive pills and never had any problem.

## 2013-01-11 NOTE — Addendum Note (Signed)
Addended by: Bertram Savin A on: 01/11/2013 03:50 PM   Modules accepted: Orders

## 2013-01-11 NOTE — Addendum Note (Signed)
Addended by: Bertram Savin A on: 01/11/2013 03:48 PM   Modules accepted: Orders

## 2013-01-31 ENCOUNTER — Ambulatory Visit (INDEPENDENT_AMBULATORY_CARE_PROVIDER_SITE_OTHER): Payer: BC Managed Care – PPO | Admitting: Gynecology

## 2013-01-31 ENCOUNTER — Ambulatory Visit (INDEPENDENT_AMBULATORY_CARE_PROVIDER_SITE_OTHER): Payer: BC Managed Care – PPO

## 2013-01-31 ENCOUNTER — Encounter: Payer: Self-pay | Admitting: Gynecology

## 2013-01-31 VITALS — BP 124/78

## 2013-01-31 DIAGNOSIS — R102 Pelvic and perineal pain: Secondary | ICD-10-CM

## 2013-01-31 DIAGNOSIS — N831 Corpus luteum cyst of ovary, unspecified side: Secondary | ICD-10-CM

## 2013-01-31 DIAGNOSIS — N83209 Unspecified ovarian cyst, unspecified side: Secondary | ICD-10-CM

## 2013-01-31 DIAGNOSIS — N83202 Unspecified ovarian cyst, left side: Secondary | ICD-10-CM

## 2013-01-31 DIAGNOSIS — N949 Unspecified condition associated with female genital organs and menstrual cycle: Secondary | ICD-10-CM

## 2013-01-31 DIAGNOSIS — N940 Mittelschmerz: Secondary | ICD-10-CM

## 2013-01-31 DIAGNOSIS — N8300 Follicular cyst of ovary, unspecified side: Secondary | ICD-10-CM

## 2013-01-31 DIAGNOSIS — K5900 Constipation, unspecified: Secondary | ICD-10-CM

## 2013-01-31 MED ORDER — MEDROXYPROGESTERONE ACETATE 150 MG/ML IM SUSP
150.0000 mg | Freq: Once | INTRAMUSCULAR | Status: AC
  Administered 2013-01-31: 150 mg via INTRAMUSCULAR

## 2013-01-31 NOTE — Progress Notes (Signed)
Patient presented to the office today to discuss the ultrasound as a result of her left lower quadrant discomfort. Patient was seen the office on 01/31/2013. She stated that on and off she's had some dyspareunia and not more frequently. Her cycles were typically regular. She's had a previous tubal sterilization procedure. Review of her record indicated she was seen in the office on March 26 had been complaining of right lower quadrant discomfort near the start of her menses just like in the cycle and had an ultrasound with the following result:  Uterus measures 9.1 x 5.5 x 4.2 cm with endometrial stripe of 9.3 mm. Right ovary was normal. Left ovary thin-walled cyst with positive color flow in the periphery. Internal low level echoes were noted. Cyst measured 2.9 x 1.7 x 1.6 and meters and no fluid in the cul-de-sac.   Ultrasound today: Uterus measured 9.6 x 6.3 x 4.8 cm with endometrial stripe 13 mm. Patient will be started her menstrual cycle within the next 10 days. Her uterus is anteverted. Right ovary difficult to image due to excessive gas in the right adnexal region ovary was seen with a small 6 m follicle. Left ovary with the continued presence of a thin wall cystic mass with internal low level echo measuring 2.8 x 2.4 x 1.7 cm in the periphery.  Assessment/plan: Persistent left ovarian cyst appears to be an endometrioma or dermoid. We will give her a trial of Depo-Provera 150 mg IM and have her return to the office in 3 months for followup ultrasound. We'll draw a CA 125 today. Its limitations were discussed. I'm going to present her with information on laparoscopy as well as on ovarian cyst. If this cyst is still present in 3 months when she returns we'll proceed with laparoscopic ovarian cystectomy. All the above was discussed with the patient Spanish. As for constipation she was instructed to take Maxitrol a one bottle today and then begin taking MiraLax 1 tablespoon with her juice every day. She is  to increase her fluid and fiber intake daily.

## 2013-01-31 NOTE — Patient Instructions (Signed)
Laparoscopa diagnstica (Diagnostic Laparoscopy) La laparoscopa es un procedimiento quirrgico relativamente simple, de uso habitual y breve (menos de una hora) que se lleva a cabo para diagnosticar y tratar enfermedades del abdomen. El laparoscopio (tubo delgado, que emite luz, del tamao de un lpiz y similar a un telescopio) se inserta en el abdomen a travs de una pequea incisin (corte realizado por un cirujano). A travs de este instrumento, el profesional podr observar [employer] rganos del interior del abdomen (vientre) y ver si hay algo anormal. La laparoscopa podr llevarse a cabo tanto en el hospital como en un consultorio. Podrn administrarle un sedante suave que lo ayudar a relajarse antes y durante el procedimiento. Una vez en la sala de operaciones, le administrarn una anestesia general (a menos que usted y el profesional elijan otro tipo de anestesia). Despus de la laparoscopa, que generalmente dura menos de Leone Brand, ser eBay sala de recuperacin durante algunas horas. Cuando regrese a Medical illustrator, la Hotel manager [employer] y [place]. RIESGOS Y COMPLICACIONES Comparados con los beneficios, los riesgos de la laparoscopia son relativamente pocos. El Management consultant con usted los riesgos antes del procedimiento. Algunos problemas que pueden ocurrir luego de la intervencin son:  Infecciones.  Hemorragias.  Puede ocurrir que se lesionen otros rganos.  Efectos secundarios de Architect. PROCEDIMIENTO Una vez que se encuentra anestesiado, el cirujano insufla el abdomen con un gas inofensivo (dixido de carbono) para Museum/gallery exhibitions officer observacin de los rganos de la pelvis. El cirujano introduce el laparoscopio a travs de una pequea incisin en el ombligo o alrededor del mismo. Podr insertar otros instrumentos, como una sonda para mover los rganos o Optometrist algn procedimiento a travs de otra pequea incisin.  En ocasiones se  toma una biopsia (muestra de tejido) para un diagnstico ms preciso o para Conservator, museum/gallery. La biopsia consiste en tomar una pequea muestra de tejido durante la laparoscopia para enviarlo al patlogo (especialista en la observacin de clulas y muestras de tejido) y que lo examine en el microscopio para un diagnstico a nivel de los tejidos. DESPUES DEL PROCEDIMIENTO  Se libera el gas del abdomen.  Las incisiones se cierran con puntos (suturas). Debido a que las incisiones son pequeas (generalmente de menos de 1 cm) las molestias son mnimas luego del procedimiento. Es posible que sienta cierto Loss adjuster, chartered. Es consecuencia de Programmer, systems del tubo mientras se encontraba dormido. Es posible que sienta algn dolor abdominal no muy intenso. Tambin podr sentir [employer] en las incisiones realizadas para insertar los instrumentos en el abdomen.  El tiempo de recuperacin es reducido, siempre que no haya habido complicaciones.  Har reposo en la sala de recuperacin hasta que se encuentre estable y se sienta bien. Si no aparecen complicaciones, podr regresar a su casa. AVERIGE LOS RESULTADOS DE SU ANLISIS Durante su visita no contar con todos los [employer]. En este caso, tenga otra entrevista con su mdico para conocerlos. No piense que el resultado es normal si no tiene noticias de su mdico o de la institucin mdica. Es Building services engineer seguimiento de todos los [place] de [place]. INSTRUCCIONES [place] todos los medicamentos tal como se le indic.  Utilice los medicamentos de venta libre o de prescripcin para Conservation officer, historic buildings, Health and safety inspector o la [place], segn se lo indique el profesional que lo asiste.  Reanude las actividades habituales cuando se le indique.  Es preferible que se duche  y no tome baos de inmersin.  Reanude la actividad sexual luego de [place], o cuando lo autoricen.  No conduzca mientras se encuentre  bajo los efectos de narcticos. SOLICITE ATENCIN MDICA SI:  Siente un dolor abdominal inexplicable.  Siente dolor en los hombros, en la regin de los tirantes.  Si se siente aturdido o siente que se va a Artist.  Siente escalofros.  Usted o su nio tienen una temperatura oral de ms de 38,9 C (102 F).  Observa un drenaje purulento (similar al pus) que proviene de alguna de las heridas.  Usted o su hijo no puede realizar movimientos intestinales o evacuar gases.  Usted o su hijo sufren nuseas o vmitos. EST SEGURO QUE:   Comprende las instrucciones para el alta mdica.  Controlar su enfermedad.  Solicitar atencin mdica de inmediato segn las indicaciones. Document Released: 08/30/2005 Document Revised: 11/22/2011 [facility] Patient Information 2014 [place], [place].  Constipacin - Adulto  (Constipation, Adult)  Constipacin significa que una persona tiene menos de 3 evacuaciones en una semana, hay dificultad para evacuar el intestino, o las heces son secas, duras, o ms grandes que lo normal. A medida que envejecemos el estreimiento es ms comn. Si intenta curar el estreimiento con medicamentos que producen la evacuacin intestinal (laxantes), el problema puede empeorar. El uso prolongado de laxantes puede hacer que los msculos del colon se debiliten. Una dieta baja en fibra, no tomar suficientes lquidos y el uso de ciertos medicamentos pueden Scientist, research (life sciences).  CAUSAS   Ciertos medicamentos, como los antidepresivos, analgsicos, suplementos de hierro, anticidos y diurticos.   Algunas enfermedades, como la diabetes, el sndrome del colon irritable (SII), enfermedad de la tiroides, o depresin.   No beber suficiente agua.   No consumir suficientes alimentos ricos en fibra.  Situaciones de estrs o viajes.CA-125 Tumor Marker CA 125 is a tumor marker that is used to help monitor the course of ovarian or endometrial cancer. PREPARATION FOR TEST No  preparation is necessary. NORMAL FINDINGS Adults: 0-35 units/mL (0-35 kilounits)/L Ranges for normal findings may vary among different laboratories and hospitals. You should always check with your doctor after having lab work or other tests done to discuss the meaning of your test results and whether your values are considered within normal limits. MEANING OF TEST  Your caregiver will go over the test results with you and discuss the importance and meaning of your results, as well as treatment options and the need for additional tests if necessary. OBTAINING THE TEST RESULTS It is your responsibility to obtain your test results. Ask the lab or department performing the test when and how you will get your results. Document Released: 09/21/2004 Document Revised: 11/22/2011 Document Reviewed: 08/07/2008 [facility] Patient Information 2014 [place], [place].    Falta de actividad fsica o de ejercicio.  No ir al bao cuando siente la necesidad.  Ignorar la necesidad sbita de Licensed conveyancer intestino.  Uso en exceso de laxantes. SNTOMAS   Evacuar el intestino menos de 3 veces a la semana.   Dificultad para mover el intestino   Tener las heces secas y duras, o ms grandes que las normales.   Sensacin de estar lleno o distendido.   Dolor en la parte baja del abdomen  No se siente alivio despus de evacuar el intestino. DIAGNSTICO  El mdico le har una historia clnica y le har un examen fsico. Pueden hacerle exmenes adicionales para el estreimiento grave. Algunas pruebas son:   Un radiografa con enema de bario  para examinar el recto, el colon y en algunos casos el intestino delgado.  Una sigmoidoscopia para examinar el colon inferior.  Una colonoscopia para examinar todo el colon. TRATAMIENTO  El tratamiento depender de la gravedad de la constipacin y de la causa. Algunos tratamientos dietticos son beber ms lquidos y comer ms alimentos ricos en fibra. El cambio en el  estilo de vida incluye hacer ejercicios de [place] regular. Si estas recomendaciones para Public relations account executive dieta y en el estilo de vida no ayudan, el mdico le puede indicar el uso de laxantes de venta libre para Pharmacologist movimiento intestinal. Los medicamentos con Engineer, drilling se pueden prescribir si los medicamentos de venta libre no lo mejoran.  INSTRUCCIONES PARA EL CUIDADO EN EL HOGAR   Aumente el consumo de alimentos con [place], como frutas, verduras, granos enteros y frijoles. Limite los azcares ricos en grasas y procesados   en su dieta, tales como papas fritas, hamburguesas, galletas, dulces y refrescos.   Puede agregar un suplemento de fibra a su dieta si no obtiene lo suficiente de los alimentos.   Debe ingerir gran cantidad de lquido para mantener la orina de tono claro o color amarillo plido.   Haga ejercicios regularmente o segn las indicaciones de su mdico.   Vaya al bao cuando sienta la necesidad de ir. No espere.  Tome slo la medicacin que le indic el profesional.  No tome otros medicamentos para la constipacin sin Science writer a su mdico. SOLICITE ATENCIN MDICA DE INMEDIATO SI:   Observa sangre brillante en las heces.   La constipacin dura ms de 4 das o [address].   Siente dolor abdominal o rectal.   Las heces son delgadas como un lpiz.  Pierde peso de [place] inexplicable. ASEGRESE DE QUE:   Comprende estas instrucciones.  Controlar su enfermedad.  Solicitar ayuda de inmediato si no mejora o empeora. Document Released: 09/19/2007 Document Revised: 02/29/2012 [facility] Patient Information 2014 [place], [place]. Quiste ovrico (Ovarian Cyst) Los ovarios son pequeos rganos que se encuentran a cada lado del tero. Los ovarios son los rganos que producen las hormonas femeninas, estrgeno y Education officer, museum. Un quiste en el ovario es una bolsa llena de lquido que puede variar en tamao. Es normal que se formen pequeos quistes en las mujeres en  edad de procrear y que an tienen sus perodos Designer, jewellery. Este tipo de quiste se denomina quiste folicular que se transforma en un quiste ovulatorio (quiste del cuerpo lteo) despus de producir los vulos. Si la mujer no queda embarazada, desaparece sin ninguna intervencin. Existen otros tipos de quistes de ovario que pueden causar problemas y necesitan ser tratados. El problema ms grave es que el quiste sea canceroso. Debe advertirse que en las mujeres menopusicas que presentan un quiste de ovario, existe un mayor riesgo de que ese quiste sea canceroso. Deben evaluarse muy rpida y [place], y IT sales professional. Esto es ms importante en las mujeres menopusicas debido al elevado porcentaje de cncer de ovario durante este perodo. CAUSAS Y TIPOS DE CNCER DE OVARIO:  QUISTE FUNCIONAL: El quiste de folculo o cuerpo lteo es un quiste funcional que aparece todos los meses durante la ovulacin, con el ciclo menstrual. Si la mujer no queda embarazada, desaparecen con el prximo ciclo menstrual. Generalmente los quistes funcionales no presentan sntomas.  ENDOMETRIOMA: este quiste aparece en la superficie del tejido del tero. Un quiste se forma en el interior o [employer]. Cada mes se desarrolla un poco ms debido a la [employer] del  perodo menstrual. Tambin se denomina "quiste de chocolate" debido a que est lleno de sangre que se vuelve color marrn. Este tipo de quiste causa dolor en la zona inferior del abdomen durante las relaciones sexuales y durante el perodo menstrual.  CISTADENOMA: Se desarrolla a partir de las clulas externas del ovario. Generalmente no son cancerosos. Pueden llegar a ser de gran tamao y causar dolor en la zona baja del abdomen y [employer] sexuales. Este tipo de quiste puede retorcerse e interrumpir el flujo de [place], lo que causa un dolor muy intenso. Tambin puede romperse y Horticulturist, commercial.  QUISTE DERMOIDE: generalmente este tipo de  quiste aparece en ambos ovarios. Puede haber diferentes tipos de tejidos en el quiste. Por ejemplo tejidos de piel, dientes, pelos o cartlago. En general no dan sntomas, excepto que sean muy grandes. Los quistes dermoides rara vez son cancerosos.  OVARIO POLIQUSTICO: es una enfermedad rara relacionada con trastornos hormonales que produce muchos quistes pequeos en ambos ovarios. Estos quistes son similares a los quistes de folculo pero nunca producen vulos y se transforman en cuerpo lteo. Pueden causar aumento del [employer], infertilidad, acn, aumento del vello facial y corporal y falta de perodos menstruales o perodos anormales. Muchas mujeres que sufren este problema presentan diabetes tipo 2. La causa exacta de este problema es desconocida. Un ovario poliqustico rara vez es canceroso.  QUISTE OVRICO TECALUTESTICO Aparece cuando hay demasiada hormona (gonadotrofina corinica humana), la que sobreestimula al ovario para producir vulos. Se observan con frecuencia cuando el mdico estimula los ovarios para la fertilizacin in vitro (bebs de probeta).  QUISTE LUTENICO: Aparece durante el embarazo. En algunos casos raros, produce una obstruccin del canal de parto. Generalmente desaparece despus del parto. SNTOMAS  Dolor o molestias en la pelvis.  Dolor durante las [employer].  Aumento de la inflamacin en el abdomen.  Perodos menstruales anormales.  Aumento del [employer] perodos [place].  Deja de menstruar y no est embarazada. DIAGNSTICO El diagnstico puede realizarse durante:  Los exmenes plvicos anuales o de rutina (frecuente).  Ecografas  Radiografas de la pelvis.  Tomografa computada  Resonancia magntica..  Anlisis de sangre. TRATAMIENTO  El tratamiento slo consiste en que el mdico controle el quiste [place], durante 2  3 meses. Muchos desaparecen espontnemente, especialmente los quistes funcionales.  Puede aspirarse  (secarse) con Marella Bile larga observndolo en una ecografa, o por laparoscopa (insertando un tubo en la pelvis a travs de una pequea incisin).  El quiste puede extirparse con laparoscopa.  En algunos casos es necesario extirparlo a travs de una incisin en la zona inferior del abdomen.  El tratamiento hormonal se utiliza para Restaurant manager, fast food ciertos tipos de [place].  Las pldoras anticonceptivas pueden utilizarse para Restaurant manager, fast food otros tipos. INSTRUCCIONES PARA EL CUIDADO DOMICILIARIO Siga las indicaciones del profesional con respecto a:  Medicamentos  Visitas de control para evaluar y Pharmacologist.  Puede ser necesario que tenga que volver o concertar una cita con otro profesional para descubrir la causa exacta del quiste, si su mdico no es Research scientist (physical sciences).  Realice un examen plvico y un Papanicolau todos los aos, segn las indicaciones.  Informe al mdico si tubo un quiste de ovario en el pasado. SOLICITE ATENCIN MDICA SI:  Los perodos se atrasan, son irregulares, le faltan o son dolorosos.  El dolor abdominal (en el vientre) o en la pelvis persisten.  El abdomen se agranda o se hincha.  Siente una opresin en la vejiga o tiene problemas para  vaciarla completamente.  Tiene dolor durante las [employer].  Tiene la sensacin de hinchazn, presin o molestias en el abdomen.  Pierde peso sin razn aparente.  Siente un Engineer, maintenance (IT).  Est constipada.  Pierde el apetito.  Aparece acn.  Aumenta el vello facial y Personal assistant.  Lenora Boys de peso sin hacer modificaciones en su actividad fsica y en su dieta habitual.  Sospecha que est embarazada. SOLICITE ATENCIN MDICA DE INMEDIATO SI:  Siente dolor abdominal cada vez ms intenso.  Si tiene ganas de vomitar (nuseas).  Le sube repentinamente la fiebre.  Siente dolor abdominal al mover el intestino.  Sus perodos menstruales son ms abundantes que lo habitual. Document Released: 06/09/2005  Document Revised: 11/22/2011 [facility] Patient Information 2014 [place], [place].

## 2013-05-04 ENCOUNTER — Ambulatory Visit: Payer: BC Managed Care – PPO | Admitting: Gynecology

## 2013-05-04 ENCOUNTER — Other Ambulatory Visit: Payer: BC Managed Care – PPO

## 2013-05-16 ENCOUNTER — Encounter: Payer: Self-pay | Admitting: Gynecology

## 2013-06-07 ENCOUNTER — Ambulatory Visit (INDEPENDENT_AMBULATORY_CARE_PROVIDER_SITE_OTHER): Payer: BC Managed Care – PPO

## 2013-06-07 ENCOUNTER — Encounter: Payer: Self-pay | Admitting: Gynecology

## 2013-06-07 ENCOUNTER — Ambulatory Visit (INDEPENDENT_AMBULATORY_CARE_PROVIDER_SITE_OTHER): Payer: BC Managed Care – PPO | Admitting: Gynecology

## 2013-06-07 VITALS — BP 124/80

## 2013-06-07 DIAGNOSIS — N949 Unspecified condition associated with female genital organs and menstrual cycle: Secondary | ICD-10-CM

## 2013-06-07 DIAGNOSIS — K5900 Constipation, unspecified: Secondary | ICD-10-CM | POA: Insufficient documentation

## 2013-06-07 DIAGNOSIS — N83209 Unspecified ovarian cyst, unspecified side: Secondary | ICD-10-CM

## 2013-06-07 DIAGNOSIS — N83202 Unspecified ovarian cyst, left side: Secondary | ICD-10-CM

## 2013-06-07 DIAGNOSIS — N8300 Follicular cyst of ovary, unspecified side: Secondary | ICD-10-CM

## 2013-06-07 DIAGNOSIS — R102 Pelvic and perineal pain: Secondary | ICD-10-CM

## 2013-06-07 DIAGNOSIS — Z8742 Personal history of other diseases of the female genital tract: Secondary | ICD-10-CM

## 2013-06-07 MED ORDER — LINACLOTIDE 145 MCG PO CAPS: 145.0000 ug | ORAL_CAPSULE | Freq: Every day | ORAL | Status: DC

## 2013-06-07 NOTE — Patient Instructions (Addendum)
Linaclotide oral capsules What is this medicine? LINACLOTIDE (lin a KLOE tide) is used to treat irritable bowel syndrome (IBS) with constipation as the main problem. It may also be used for relief of chronic constipation. This medicine may be used for other purposes; ask your health care provider or pharmacist if you have questions. What should I tell my health care provider before I take this medicine? They need to know if you have any of these conditions: -history of stool (fecal) impaction -now have diarrhea or have diarrhea often -other medical condition -stomach or intestinal disease, including bowel obstruction or abdominal adhesions -an unusual or allergic reaction to linaclotide, other medicines, foods, dyes, or preservatives -pregnant or trying to get pregnant -breast-feeding How should I use this medicine? Take this medicine by mouth with a glass of water. Follow the directions on the prescription label. Do not cut, crush or chew this medicine. Take on an empty stomach, at least 30 minutes before your first meal of the day. Take your medicine at regular intervals. Do not take your medicine more often than directed. Do not stop taking except on your doctor's advice. A special MedGuide will be given to you by the pharmacist with each prescription and refill. Be sure to read this information carefully each time. Talk to your pediatrician regarding the use of this medicine in children. This medicine is not approved for use in children. Overdosage: If you think you've taken too much of this medicine contact a poison control center or emergency room at once. Overdosage: If you think you have taken too much of this medicine contact a poison control center or emergency room at once. NOTE: This medicine is only for you. Do not share this medicine with others. What if I miss a dose? If you miss a dose, just skip that dose. Wait until your next dose, and take only that dose. Do not take double or  extra doses. What may interact with this medicine? -certain medicines for bowel problems or bladder incontinence (these can cause constipation) This list may not describe all possible interactions. Give your health care provider a list of all the medicines, herbs, non-prescription drugs, or dietary supplements you use. Also tell them if you smoke, drink alcohol, or use illegal drugs. Some items may interact with your medicine. What should I watch for while using this medicine? Visit your doctor for regular check ups. Tell your doctor if your symptoms do not get better or if they get worse. Diarrhea is a common side effect of this medicine. It often begins within 2 weeks of starting this medicine. Stop taking this medicine and call your doctor if you get severe diarrhea. Stop taking this medicine and call your doctor or go to the nearest hospital emergency room right away if you develop unusual or severe stomach-area (abdominal) pain, especially if you also have bright red, bloody stools or black stools that look like tar. What side effects may I notice from receiving this medicine? Side effects that you should report to your doctor or health care professional as soon as possible: -allergic reactions like skin rash, itching or hives, swelling of the face, lips, or tongue -black, tarry stools -bloody or watery diarrhea -new or worsening stomach pain -severe or prolonged diarrhea  Side effects that usually do not require medical attention (Report these to your doctor or health care professional if they continue or are bothersome.): -bloating -gas -loose stools This list may not describe all possible side effects. Call your doctor   for medical advice about side effects. You may report side effects to FDA at 1-800-FDA-1088. Where should I keep my medicine? Keep out of the reach of children. Store at room temperature between 20 and 25 degrees C (68 and 77 degrees F). Keep this medicine in the original  container. Keep tightly closed in a dry place. Do not remove the desiccant packet from the bottle, it helps to protect your medicine from moisture. Throw away any unused medicine after the expiration date. NOTE: This sheet is a summary. It may not cover all possible information. If you have questions about this medicine, talk to your doctor, pharmacist, or health care provider.  2013, Elsevier/Gold Standard. (05/18/2011 1:05:27 PM)  

## 2013-06-07 NOTE — Progress Notes (Signed)
Patient presented to the office today for discussion of her ultrasound. See previous detailed note from 01/31/2013. Office visit May 21 demonstrated the following on ultrasound:  Uterus measured 9.6 x 6.3 x 4.8 cm with endometrial stripe 13 mm. Patient will be started her menstrual cycle within the next 10 days. Her uterus is anteverted. Right ovary difficult to image due to excessive gas in the right adnexal region ovary was seen with a small 6 m follicle. Left ovary with the continued presence of a thin wall cystic mass with internal low level echo measuring 2.8 x 2.4 x 1.7 cm in the periphery  The assessment and plan at that time of office visit was as follows:  Persistent left ovarian cyst appears to be an endometrioma or dermoid. We will give her a trial of Depo-Provera 150 mg IM and have her return to the office in 3 months for followup ultrasound. We'll draw a CA 125 if cyst is still persistent.Its limitations were discussed. I'm going to present her with information on laparoscopy as well as on ovarian cyst. If this cyst is still present in 3 months when she returns we'll proceed with laparoscopic ovarian cystectomy.   Ultrasound today: Uterus measures 9.3 x 5.6 x 3.7 cm endometrial stripe at 2.4 mm. Right ovary normal follicle 17 mm. Left ovary normal with small follicle. Previous cystocele and today's exam.  Assessment/plan: Patient with previous left ovarian cyst complete resolution. Patient otherwise asymptomatic. With the exception that she continues to suffer from constipation despite using MiraLax daily in the past and increasing her fiber and fluid intake. She is going to be prescribed Linzess 145 mcg capsule to take one daily. She'll return to the office next year for annual exam or when necessary.

## 2013-07-19 ENCOUNTER — Other Ambulatory Visit: Payer: Self-pay

## 2013-07-19 ENCOUNTER — Telehealth: Payer: Self-pay

## 2013-07-19 NOTE — Telephone Encounter (Signed)
Patient informed. 

## 2013-07-19 NOTE — Telephone Encounter (Signed)
Patient said at her 06/07/13 visit she mentioned to you that she has increased appetite. She c/o lots of stress eating and getting up in the night to eat because of anxiety.  She said you had mentioned that if it continued you could prescribe for her a pill to help suppress her appetite "but that it would make my heart race".  She said she would like you to go ahead and prescribe it for her as she has gained 8 lbs.  She said she has trouble getting off at her job to come for office visit.

## 2013-07-19 NOTE — Telephone Encounter (Signed)
Cannot prescribe without seeing patient

## 2013-07-24 ENCOUNTER — Ambulatory Visit (INDEPENDENT_AMBULATORY_CARE_PROVIDER_SITE_OTHER): Payer: BC Managed Care – PPO | Admitting: Gynecology

## 2013-07-24 ENCOUNTER — Encounter: Payer: Self-pay | Admitting: Gynecology

## 2013-07-24 VITALS — BP 126/78

## 2013-07-24 DIAGNOSIS — R635 Abnormal weight gain: Secondary | ICD-10-CM

## 2013-07-24 DIAGNOSIS — F411 Generalized anxiety disorder: Secondary | ICD-10-CM

## 2013-07-24 DIAGNOSIS — F32A Depression, unspecified: Secondary | ICD-10-CM

## 2013-07-24 DIAGNOSIS — F329 Major depressive disorder, single episode, unspecified: Secondary | ICD-10-CM

## 2013-07-24 DIAGNOSIS — F419 Anxiety disorder, unspecified: Secondary | ICD-10-CM | POA: Insufficient documentation

## 2013-07-24 MED ORDER — VENLAFAXINE HCL 37.5 MG PO TABS: ORAL_TABLET | ORAL | Status: DC

## 2013-07-24 NOTE — Patient Instructions (Signed)
Venlafaxine extended-release capsules Qu es este medicamento? La VENLAFAXINA se [place] para el tratamiento de la depresin, ansiedad y el trastorno de pnico. [place] medicamento puede ser utilizado para otros usos; si tiene alguna pregunta consulte con su proveedor de atencin mdica o con su farmacutico. MARCAS COMERCIALES DISPONIBLES: Effexor XR Qu le debo informar a mi profesional de la salud antes de tomar este medicamento? Necesita saber si usted presenta alguno de los siguientes problemas o situaciones: -trastornos sanguneos -glaucoma -enfermedad cardiaca -alta presin sangunea -alto nivel de colesterol -enfermedad renal -enfermedad heptica -niveles bajos de sodio en la sangre -mana o trastorno bipolar -convulsiones -ideas suicidas, planes o intento; si usted o alguien de su familia ha intentado un suicidio previo -toma medicamentos que tratan o previene cogulos sanguneos -enfermedad tiroidea -una reaccin alrgica o inusual a la venlafaxina, desvenlafaxina, a otros medicamentos, alimentos, colorantes o conservadores -si est embarazada o buscando quedar embarazada -si est amamantando a un beb Cmo debo utilizar este medicamento? Tome este medicamento por va oral con un vaso lleno de agua. Siga las instrucciones de la etiqueta del [place]. No corte, triture ni mstique este medicamento. Tome este medicamento con alimentos. Si es necesario, se puede abrir con cuidado la cpsula y Scientist, clinical (histocompatibility and immunogenetics) todo el contenido sobre en una cuchara de pur de [place] fra. Trague la mezcla de pur de Company secretary bolita enseguida sin Nurse, children's y seguir con un vaso de agua para asegurar la ingestin total de las bolitas. Trate de tomar su medicamento aproximadamente a la [employer]. No tome su medicamento con una frecuencia mayor a la indicada. No deje de tomar [employer] repentinamente a menos que as indique su mdico. El detener este medicamento demasiado rpido puede  causar efectos secundarios graves o puede empeorar su condicin.  Su farmacutico le dar una gua del medicamento especial con cada receta y relleno. Asegrese de leer esta informacin cada vez cuidadosamente. Hable con su pediatra para informarse acerca del uso de este medicamento en nios. Puede requerir atencin especial. Sobredosis: Pngase en contacto inmediatamente con un centro toxicolgico o una sala de urgencia si usted cree que haya tomado demasiado medicamento. ATENCIN: [employer] es solo para usted. No comparta este medicamento con nadie. Qu sucede si me olvido de una dosis? Si olvida una dosis, tmela lo antes posible. Si es casi la hora de la prxima dosis, tome slo esa dosis. No tome dosis adicionales o dobles. Qu puede interactuar con este medicamento? No tome esta medicina con ninguno de los siguientes medicamentos: -cisapride -desvenlafaxina -dofetilida -dronedarona -duloxetina -levomilnacipran -linezolid -IMAOs, tales como Carbex, Eldepryl, Marplan, Nardil y [place]arnate -azul de metileno (va intravenosa) -milnacipran -pimozida -tioridazina -ziprasidona Esta medicina tambin puede interactuar con los siguientes medicamentos: -aspirina o medicamentos tipo aspirina -ciertos medicamentos para la depresin, ansiedad o trastornos psicticos -ciertos medicamentos para las migraas, tales como almotriptn, eletriptn, frovatriptn, naratriptn, rizatriptn, sumatriptn, zolmitriptn -cimetidina -clozapina -diurticos -fentanilo -furazolidona -indinavir -isoniazida -quetoconazol -litio -medicamentos para conciliar el sueo -medicamentos que tratan o previenen cogulos sanguneos, tales como warfarina, enoxaparina y dalteparina -metoprolol -los [place], medicamentos para el dolor o inflamacin, como ibuprofeno o naproxeno -otros medicamentos que prolongan el intervalo QT (causa un ritmo cardiaco anormal) -procarbazina -rasagilina -suplementos como hierba de  [place], kava kava, valeriana -tramadol -triptfano Puede ser que esta lista no menciona todas las posibles interacciones. Informe a su profesional de [employer] de [employer] productos a base de hierbas, medicamentos de [place] o suplementos nutritivos que est tomando. Si usted fuma, consume bebidas  alcohlicas o si utiliza drogas ilegales, indqueselo tambin a su profesional de [employer]. Algunas sustancias pueden interactuar con su medicamento. A qu debo estar atento al usar [employer]? Informe a su mdico si sus sntomas no mejoran o si empeoran. Visite a su mdico o a su profesional de la salud para chequear su evolucin peridicamente. Debido que puede ser necesario tomar este medicamento durante varias semanas para que sea posible observar sus efectos en forma [place], es importante que sigue su tratamiento como recetado por su mdico.  Los pacientes y sus familias deben estar atentos si empeora la depresin o ideas suicidas. Tambin est atento a cambios repentinos o severos de emocin, tales como el sentirse ansioso, agitado, lleno de pnico, irritable, hostil, agresivo, impulsivo, inquietud severa, demasiado excitado y hiperactivo o dificultad para conciliar el sueo. Si esto ocurre, especialmente al comenzar con el tratamiento o al cambiar de dosis, comunquese con su profesional de [employer]. Este medicamento puede aumentar la presin sangunea. Consulte con su mdico para instrucciones sobre cmo controlar su presin sangunea mientras toma este medicamento. Puede experimentar mareos o somnolencia. No conduzca ni utilice maquinaria ni haga nada que Scientist, research (life sciences) en estado de alerta hasta que sepa cmo le afecta este medicamento. No se siente ni se ponga de pie con rapidez, especialmente si es un paciente de edad avanzada. Esto reduce el riesgo de mareos o [employer]. El alcohol puede interferir con el efecto de [place]. Evite consumir bebidas alcohlicas. Se le podr  secar la boca. Masticar chicle sin azcar, chupar caramelos duros y beber agua en abundancia le ayudar a mantener la boca hmeda. Qu efectos secundarios puedo tener al [employer] este medicamento? Efectos secundarios que debe informar a su mdico o a Producer, television/film/video de la salud tan pronto como sea posible: -Therapist, art como erupcin cutnea, picazn o urticarias, hinchazn de la cara, labios o lengua -problemas respiratorios -cambios en la visin -alucinaciones, prdida del contacto con la realidad -convulsiones -ideas suicidas u otros cambios de humor -dificultad para orinar o cambios en el volumen de orina -sangrado, magulladuras inusuales Efectos secundarios que, por lo general, no requieren Psychologist, prison and probation services (debe informarlos a su mdico o a su profesional de la salud si persisten o si son molestos): -cambios en el deseo sexual o capacidad -estreimiento -aumento de la sudoracin -prdida del apetito -nuseas -temblores -prdida de peso Puede ser que esta lista no menciona todos los posibles efectos secundarios. Comunquese a su mdico por asesoramiento mdico [employer]. Usted puede informar los efectos secundarios a la FDA por telfono al 1-800-FDA-1088. Dnde debo guardar mi medicina? Mantngala fuera del alcance de los nios. Gurdela a [employer], entre 20 y 25 grados C (21 y 29 grados F), en un Paramedic. Deseche todo el medicamento que no haya utilizado, despus de la fecha de vencimiento. ATENCIN: Este folleto es un resumen. Puede ser que no cubra toda la posible informacin. Si usted tiene preguntas acerca de esta medicina, consulte con su mdico, su farmacutico o su profesional de Radiographer, therapeutic.  2014, Elsevier/Gold Standard. (2012-07-17 17:34:40)

## 2013-07-24 NOTE — Addendum Note (Signed)
Addended by: Ok Edwards on: 07/24/2013 04:15 PM   Modules accepted: Level of Service
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
## 2013-07-25 LAB — TSH: TSH: 2.921 u[IU]/mL (ref 0.350–4.500)

## 2013-08-06 ENCOUNTER — Other Ambulatory Visit: Payer: Self-pay

## 2013-08-06 DIAGNOSIS — Z1231 Encounter for screening mammogram for malignant neoplasm of breast: Secondary | ICD-10-CM

## 2013-09-27 ENCOUNTER — Other Ambulatory Visit: Payer: Self-pay

## 2013-09-27 DIAGNOSIS — Z9882 Breast implant status: Secondary | ICD-10-CM

## 2013-09-27 DIAGNOSIS — Z1231 Encounter for screening mammogram for malignant neoplasm of breast: Secondary | ICD-10-CM

## 2013-10-19 ENCOUNTER — Ambulatory Visit
Admission: RE | Admit: 2013-10-19 | Discharge: 2013-10-19 | Disposition: A | Discharge status: Home / Self Care | Payer: BC Managed Care – PPO | Source: Ambulatory Visit

## 2013-10-19 ENCOUNTER — Other Ambulatory Visit: Payer: Self-pay

## 2013-10-19 DIAGNOSIS — Z9882 Breast implant status: Secondary | ICD-10-CM

## 2013-10-19 DIAGNOSIS — Z1231 Encounter for screening mammogram for malignant neoplasm of breast: Secondary | ICD-10-CM

## 2014-01-01 ENCOUNTER — Ambulatory Visit (INDEPENDENT_AMBULATORY_CARE_PROVIDER_SITE_OTHER): Payer: BC Managed Care – PPO | Admitting: Gynecology

## 2014-01-01 ENCOUNTER — Encounter: Payer: Self-pay | Admitting: Gynecology

## 2014-01-01 ENCOUNTER — Other Ambulatory Visit (HOSPITAL_COMMUNITY)
Admission: RE | Admit: 2014-01-01 | Discharge: 2014-01-01 | Disposition: A | Discharge status: Home / Self Care | Payer: BC Managed Care – PPO | Source: Ambulatory Visit | Attending: Gynecology | Admitting: Gynecology

## 2014-01-01 VITALS — BP 122/76 | Ht 64.0 in | Wt 185.0 lb

## 2014-01-01 DIAGNOSIS — F411 Generalized anxiety disorder: Secondary | ICD-10-CM

## 2014-01-01 DIAGNOSIS — F419 Anxiety disorder, unspecified: Secondary | ICD-10-CM

## 2014-01-01 DIAGNOSIS — R635 Abnormal weight gain: Secondary | ICD-10-CM

## 2014-01-01 DIAGNOSIS — Z808 Family history of malignant neoplasm of other organs or systems: Secondary | ICD-10-CM

## 2014-01-01 DIAGNOSIS — Z1151 Encounter for screening for human papillomavirus (HPV): Secondary | ICD-10-CM | POA: Insufficient documentation

## 2014-01-01 DIAGNOSIS — Z01419 Encounter for gynecological examination (general) (routine) without abnormal findings: Secondary | ICD-10-CM | POA: Insufficient documentation

## 2014-01-01 MED ORDER — PHENTERMINE HCL 37.5 MG PO CAPS
37.5000 mg | ORAL_CAPSULE | ORAL | Status: DC
Start: 2014-01-01 — End: 2015-10-10

## 2014-01-01 MED ORDER — ALPRAZOLAM 0.25 MG PO TABS: 0.2500 mg | ORAL_TABLET | Freq: Every evening | ORAL | Status: DC | PRN

## 2014-01-01 NOTE — Progress Notes (Addendum)
Denise Byrd [birth year] [id number]   History:    [age] y.o.  for annual gyn exam who has complained on and off at times the floor done discomfort during her time of ovulation which is resolved with ibuprofen. Patient with prior tubal sterilization procedure. Patient normal menstrual cycles. Patient complained of weight gain. Review of her records indicated that last year she was weighing 164 pounds and today she was weighing 185 pounds. Patient denies any past history of abnormal Pap smears. Last year she was going through a divorce and was placed on Effexor 75 mg daily for depression but she has been off of it and only suffers at times from anxiety.. Several years ago patient took phentermine 37.5 mg for 3 months which helped her loose weight and she is interested in doing  once again for 3 more months.  Past medical history,surgical history, family history and social history were all reviewed and documented in the EPIC chart.  Gynecologic History Patient's last menstrual period was 12/12/2013. Contraception: tubal ligation Last Pap: 2012. Results were: normal Last mammogram: 2014. Results were: normal  Obstetric History OB History  Gravida Para Term Preterm AB SAB TAB Ectopic Multiple Living  3 3 3       3     # Outcome Date GA Lbr Len/2nd Weight Sex Delivery Anes PTL Lv  3 TRM     M SVD  N Y  2 TRM     M SVD  N Y  1 TRM     F SVD  N Y       ROS: A ROS was performed and pertinent positives and negatives are included in the history.  GENERAL: No fevers or chills. HEENT: No change in vision, no earache, sore throat or sinus congestion. NECK: No pain or stiffness. CARDIOVASCULAR: No chest pain or pressure. No palpitations. PULMONARY: No shortness of breath, cough or wheeze. GASTROINTESTINAL: No abdominal pain, nausea, vomiting or diarrhea, melena or bright red blood per rectum. GENITOURINARY: No urinary frequency, urgency, hesitancy or dysuria. MUSCULOSKELETAL: No joint or muscle pain,  no back pain, no recent trauma. DERMATOLOGIC: No rash, no itching, no lesions. ENDOCRINE: No polyuria, polydipsia, no heat or cold intolerance. No recent change in weight. HEMATOLOGICAL: No anemia or easy bruising or bleeding. NEUROLOGIC: No headache, seizures, numbness, tingling or weakness. PSYCHIATRIC: No depression, no loss of interest in normal activity or change in sleep pattern.     Exam: chaperone present  BP 122/76  Ht 5\' 4"  (1.626 m)  Wt 185 lb (83.915 kg)  BMI 31.74 kg/m2  LMP 12/12/2013  Body mass index is 31.74 kg/(m^2).  General appearance : Well developed well nourished female. No acute distress HEENT: Neck supple, trachea midline, no carotid bruits, no thyroidmegaly Lungs: Clear to auscultation, no rhonchi or wheezes, or rib retractions  Heart: Regular rate and rhythm, no murmurs or gallops Breast:Examined in sitting and supine position were symmetrical in appearance, no palpable masses or tenderness,  no skin retraction, no nipple inversion, no nipple discharge, no skin discoloration, no axillary or supraclavicular lymphadenopathy Abdomen: no palpable masses or tenderness, no rebound or guarding Extremities: no edema or skin discoloration or tenderness  Pelvic:  Bartholin, Urethra, Skene Glands: Within normal limits             Vagina: No gross lesions or discharge  Cervix: No gross lesions or discharge  Uterus  anteverted, normal size, shape and consistency, non-tender and mobile  Adnexa  Without masses or tenderness  Anus and perineum  normal   Rectovaginal  normal sphincter tone without palpated masses or tenderness             Hemoccult not indicated     Assessment/Plan:  43 y.o. female for annual exam who is no longer suffering from depression but occasionally anxiety. Patient will be prescribed Xanax 0.25 mg to take 1 by mouth daily only on a when necessary basis. She'll return back later this week and a fasting state for her fasting blood work which will  include the following: Fasting lipid profile, comprehensive metabolic panel, TSH, CBC, and urinalysis. A Pap smear was done today. The new guidelines were discussed. Patient will be prescribed phentermine 37.5 mg daily for her appetite suppressant. We discussed also a combination of appropriate low calorie low fat diet along with regular exercise. She'll return to the office monthly for the next 3 months for pulmonary and cardiac auscultation as well as for wake management. Patient was counseled and ligature information provided on potential risk of this medication to include pulmonary hypertension or cardiac valvular disease. Information was provided Spanish patient fully understands and accepts the risk.  Note: This dictation was prepared with  Dragon/digital dictation along withSmart phrase technology. Any transcriptional errors that result from this process are unintentional.   Ok EdwardsJuan H Avabella Wailes MD, 4:14 PM 01/01/2014

## 2014-01-01 NOTE — Patient Instructions (Signed)
Alprazolam tablets Qu es este medicamento? El ALPRAZOLAM es una benzodiacepina. Se utiliza para tratar la ansiedad y los ataques de pnico. [place] medicamento puede ser utilizado para otros usos; si tiene alguna pregunta consulte con su proveedor de atencin mdica o con su farmacutico. MARCAS COMERCIALES DISPONIBLES: Xanax Qu le debo informar a mi profesional de la salud antes de tomar este medicamento? Necesita saber si usted presenta alguno de los siguientes problemas o situaciones: -problema de alcoholismo o drogadiccin -trastorno bipolar, depresin, psicosis u otros problemas de salud mental -glaucoma -enfermedad renal o heptica -enfermedad pulmonar o respiratoria -miastenia gravis -enfermedad de Parkinson -porfiria -convulsiones o antecedentes de convulsiones -ideas suicidas -una reaccin alrgica o inusual al alprazolam, a otras benzodiacepinas, alimentos, colorantes o conservantes -si est embarazada o buscando quedar embarazada -si est amamantando a un beb Cmo debo utilizar este medicamento? Tome este medicamento por va oral con un vaso de agua. Siga las instrucciones de la etiqueta del [place]. Tome sus dosis a intervalos regulares. No tome su medicamento con una frecuencia mayor a la indicada. Si ha venido tomando [employer] de [place] regular durante algn tiempo, no deje de tomarlo repentinamente. Debe reducir gradualmente la dosis para no sufrir efectos secundarios severos. Consulte a su mdico o a su profesional de la salud por asesoramiento. Aun despus de dejar de tomarlo, los efectos del medicamento en su cuerpo pueden perdurar [employer]. Hable con su pediatra para informarse acerca del uso de este medicamento en nios. Puede requerir atencin especial. Sobredosis: Pngase en contacto inmediatamente con un centro toxicolgico o una sala de urgencia si usted cree que haya tomado demasiado medicamento. ATENCIN: [employer] es solo para usted.  No comparta este medicamento con nadie. Qu sucede si me olvido de una dosis? Si olvida una dosis, tmela lo antes posible. Si es casi la hora de la prxima dosis, tome slo esa dosis. No tome dosis adicionales o dobles. Qu puede interactuar con este medicamento? No tome esta medicina junto con ninguno de los siguientes medicamentos: -algunos medicamentos para la infeccin por VIH o SIDA -quetoconazol -itraconazol Esta medicina tambin puede interactuar con los siguientes medicamentos: -pldoras anticonceptivas -algunos antibiticos macrlidos, tales como claritromicina, eritromicina o troleandomicina -cimetidina -ciclosporina -ergotamina -jugo de toronja -suplementos dietticos o a base de hierbas, como kava kava, melatonina, dehidroepiandrosterona, DHEA, hierba de [place] o valeriana -imatinib, STI-571 -isoniazida -levodopa -medicamentos para la depresin, ansiedad o trastornos psicticos -analgsicos recetados -rifampicina, rifapentina o rifabutina -algunos medicamentos para la presin sangunea o problemas cardiacos -algunos medicamentos para las convulsiones, tales como [place], Ali Molina, [place], Museum/gallery curator o primidona Puede ser que esta lista no menciona todas las posibles interacciones. Informe a su profesional de [employer]	[place] de [employer] productos a base de hierbas, medicamentos de [place] o suplementos nutritivos que est tomando. Si usted fuma, consume bebidas alcohlicas o si utiliza drogas ilegales, indqueselo tambin a su profesional de [employer]	[place]. Algunas sustancias pueden interactuar con su medicamento. A qu debo estar atento al usar Coca[employer]Cola? Visite a su mdico o a su profesional de la salud para chequear su evolucin peridicamente. Su cuerpo puede hacerse dependiente del medicamento. Por esta razn, pregunte a su mdico o a su profesional de la salud si todava necesita tomarlo. Puede experimentar somnolencia o mareos. No conduzca ni utilice  maquinaria, ni haga nada que Associate Professor en estado de alerta hasta que sepa cmo le afecta este medicamento. Para reducir el riesgo de mareos o [place], no se ponga de pie ni se siente  con rapidez, especialmente si es un paciente de [place]. El alcohol puede aumentar su somnolencia y [place]. Evite consumir bebidas alcohlicas. No se trate usted mismo si tiene tos, resfro o Environmental consultant sin Science writer a su mdico o a su profesional de Radiographer, therapeutic. Algunos ingredientes pueden aumentar los posibles efectos secundarios. Qu efectos secundarios puedo tener al [employer] este medicamento? Efectos secundarios que debe informar a su mdico o a Producer, television/film/video de la salud tan pronto como sea posible: -Therapist, art como erupcin cutnea, picazn o urticarias, hinchazn de la cara, labios o lengua -confusin, tendencia a olvidar -depresin -dificultad para conciliar el sueo -dificultad para hablar -sensacin de desmayos o mareos, cadas -cambios de humor, excitabilidad o comportamiento agresivo -calambres musculares -dificultad para orinar o cambios en el volumen de orina -cansancio o debilidad inusual Efectos secundarios que, por lo general, no requieren atencin mdica (debe informarlos a su mdico o a su profesional de la salud si persisten o si son molestos): -cambios en el deseo sexual o capacidad -cambios del apetito Puede ser que esta lista no menciona todos los posibles efectos secundarios. Comunquese a su mdico por asesoramiento mdico [employer]. Usted puede informar los efectos secundarios a la FDA por telfono al 1-800-FDA-1088. Dnde debo guardar mi medicina? Mantngala fuera del alcance de los nios. Este medicamento puede ser abusado. Mantenga su medicamento en un lugar seguro para protegerlo contra robos. No comparta este medicamento con nadie. Es peligroso vender o Restaurant manager, fast food y est prohibido por la ley. Gurdela a [employer],  entre 20 y 25 grados C (42 y 37 grados F). Deseche todo el medicamento que no haya utilizado, despus de la fecha de vencimiento. ATENCIN: Este folleto es un resumen. Puede ser que no cubra toda la posible informacin. Si usted tiene preguntas acerca de esta medicina, consulte con su mdico, su farmacutico o su profesional de Radiographer, therapeutic.  2014, Elsevier/Gold Standard. (2007-06-23 11:33:00) Phentermine tablets or capsules Qu es este medicamento? La FENTERMINA reduce su apetito. Si la combina con una dieta de bajas caloras y ejercio puede ayudarle a Publishing copy de [place]. Este medicamento puede ser utilizado para otros usos; si tiene alguna pregunta consulte con su proveedor de atencin mdica o con su farmacutico. MARCAS COMERCIALES DISPONIBLES: Adipex-P, Atti-Plex P , Atti-Plex P Spansule , Fastin, Pro-Fast, Tara-8  Qu le debo informar a mi profesional de la salud antes de tomar este medicamento? Necesita saber si usted presenta alguno de los siguientes problemas o situaciones: -agitacin -glaucoma -enfermedad cardiaca -alta presin sangunea -antecedentes de abuso de substancias -enfermedad pulmonar llamada Hipertensin Pulmonar Primaria -si ha tomado un IMAO, tales como Carbex, Eldepryl, Marplan, Nardil o [place]arnate en los ltimos 14 das -enfermedad tiroidea -una reaccin alrgica o inusual a la fentermina, a otros medicamentos, alimentos, colorantes o conservantes -si est embarazada o buscando quedar embarazada -si est amamantando a un beb Cmo debo utilizar este medicamento? Tome este medicamento por va oral con un vaso de agua. Siga las instrucciones de la etiqueta del [place]. Este medicamento generalmente se toma 30 minutos antes o 1 a 2 horas despus de desayunar. Evite tomar este medicamento por la tarde. Puede interferir con el sueo. Tome sus dosis a intervalos regulares. No tome su medicamento con una frecuencia mayor a la indicada. Hable con su pediatra para informarse  acerca del uso de este medicamento en nios. Puede requerir atencin especial. Sobredosis: Pngase en contacto inmediatamente con un centro toxicolgico o una sala de urgencia si usted cree que haya tomado  demasiado medicamento. ATENCIN: Reynolds AmericanEste medicamento es solo para usted. No comparta este medicamento con nadie. Qu sucede si me olvido de una dosis? Si olvida una dosis, tmela lo antes posible. Si es casi la hora de la prxima dosis, tome slo esa dosis. No tome dosis adicionales o dobles. Qu puede interactuar con este medicamento? No tome esta medicina con ninguno de los siguientes medicamentos: -duloxetina -IMAOs, tales como Carbex, Eldepryl, Marplan, Nardil y Parnate -medicamentos para resfros o problemas respiratorios, tales como seudoefedrina o fenilefrina -procarbazina -sibutramine -ISRS, tales como citalopram, escitalopram, fluoxetina, fluvoxamina, paroxetina y sertralina -estimulantes, tales como dexmetilfenidato, metilfenidato o modafinil -venlafaxina Esta medicina tambin puede interactuar con los siguientes medicamentos: -medicamentos para la diabetes Puede ser que esta lista no menciona todas las posibles interacciones. Informe a su profesional de Beazer Homesla salud de [employer] los productos a base de hierbas, medicamentos de [place] Cityvent[place] libre o suplementos nutritivos que est tomando. Si usted fuma, consume bebidas alcohlicas o si utiliza drogas ilegales, indqueselo tambin a su profesional de Beazer Homesla salud. Algunas sustancias pueden interactuar con su medicamento. A qu debo estar atento al usar PPL Corporationeste medicamento? Informe a su mdico de inmediato si siente que le falta el aliento mientras realiza sus actividades habituales. No tome este medicamento dentro de 6 horas de la hora de [place] Rock Springsacostarse. Puede impedirle dormir. Evite las bebidas que contienen cafena y trata de Pharmacologistmantener un horario de acostarse habitual cada noche. Este medicamento fue diseado para ser utilizado en combinacin con una dieta  saludable y ejercicio. De esta manera se obtienen los Safeco Corporationmejores resultados. Este medicamento slo est indicado para uso a Product managercorto plazo. Con el tiempo, su peso puede estabilizarse. A partir de Winn-Dixieese momento, el medicamento slo le ayudar a Pharmacologistmantener su Pathmark Storesnuevo peso. No aumente ni modifique su dosis de ninguna manera sin consultar a su mdico. Puede experimentar mareos o somnolencia. No conduzca ni utilice maquinaria ni haga nada que Scientist, research (life sciences)le exija permanecer en estado de alerta hasta que sepa cmo le afecta este medicamento. No se siente ni se ponga de pie con rapidez, especialmente si es un paciente de edad avanzada. Esto reduce el riesgo de mareos o Newell Rubbermaiddesmayos. El alcohol puede aumentar los mareos y la somnolencia. Evite consumir bebidas alcohlicas. Qu efectos secundarios puedo tener al Boston Scientificutilizar este medicamento? Efectos secundarios que debe informar a su mdico o a Producer, television/film/videosu profesional de la salud tan pronto como sea posible: -Journalist, newspaperdolor en el pecho, palpitaciones -depresin o cambios severos de estados de nimo -aumento de la presin sangunea -irritabilidad -nerviosismo o inquietud -mareos severos -falta de aliento -problemas para orinar -hinchazn inusual de las piernas -vmito Efectos secundarios que, por lo general, no requieren Psychologist, prison and probation servicesatencin mdica (debe informarlos a su mdico o a su profesional de la salud si persisten o si son molestos): -visin borrosa u otros problemas de ojo -cambios en la capacidad o el deseo sexual -estreimiento o diarrea -dificultad para conciliar el sueo -boca seca o sabor desagradable -dolor de cabeza -nuseas Puede ser que esta lista no menciona todos los posibles efectos secundarios. Comunquese a su mdico por asesoramiento mdico Hewlett-Packardsobre los efectos secundarios. Usted puede informar los efectos secundarios a la FDA por telfono al 1-800-FDA-1088. Dnde debo guardar mi medicina? Mantngala fuera del alcance de los nios. Este medicamento puede ser abusado. Mantenga su medicamento  en un lugar seguro para protegerlo contra robos. No comparta este medicamento con nadie. Es peligroso vender o Restaurant manager, fast foodregalar este medicamento y est prohibido por la ley. Gurdela a Sanmina-SCItemperatura ambiente, entre 20 y 25 grados C [phone] grados F). Mantenga el envase bien cerrado. Deseche todo el medicamento que no haya utilizado, despus de la fecha de vencimiento. ATENCIN: Este folleto es un resumen. Puede ser que no cubra toda la posible informacin. Si usted tiene preguntas acerca de esta medicina, consulte con su mdico, su farmacutico o su profesional de Radiographer, therapeuticla salud.  2014, Elsevier/Gold Standard. (2010-10-22 19:33:11)

## 2014-01-02 ENCOUNTER — Ambulatory Visit: Payer: BC Managed Care – PPO

## 2014-01-02 DIAGNOSIS — Z808 Family history of malignant neoplasm of other organs or systems: Secondary | ICD-10-CM

## 2014-01-02 DIAGNOSIS — R635 Abnormal weight gain: Secondary | ICD-10-CM

## 2014-01-02 DIAGNOSIS — Z01419 Encounter for gynecological examination (general) (routine) without abnormal findings: Secondary | ICD-10-CM

## 2014-01-02 LAB — TSH: TSH: 2.616 u[IU]/mL (ref 0.350–4.500)

## 2014-01-02 LAB — CBC WITH DIFFERENTIAL/PLATELET
Basophils Absolute: 0 10*3/uL (ref 0.0–0.1)
Basophils Relative: 0 % (ref 0–1)
Eosinophils Absolute: 0.1 10*3/uL (ref 0.0–0.7)
Eosinophils Relative: 2 % (ref 0–5)
HCT: 40.4 % (ref 36.0–46.0)
Hemoglobin: 13.9 g/dL (ref 12.0–15.0)
Lymphocytes Relative: 28 % (ref 12–46)
Lymphs Abs: 1.7 10*3/uL (ref 0.7–4.0)
MCH: 30.3 pg (ref 26.0–34.0)
MCHC: 34.4 g/dL (ref 30.0–36.0)
MCV: 88 fL (ref 78.0–100.0)
Monocytes Absolute: 0.5 10*3/uL (ref 0.1–1.0)
Monocytes Relative: 8 % (ref 3–12)
Neutro Abs: 3.7 10*3/uL (ref 1.7–7.7)
Neutrophils Relative %: 62 % (ref 43–77)
Platelets: 235 10*3/uL (ref 150–400)
RBC: 4.59 MIL/uL (ref 3.87–5.11)
RDW: 14 % (ref 11.5–15.5)
WBC: 5.9 10*3/uL (ref 4.0–10.5)

## 2014-01-02 LAB — COMPREHENSIVE METABOLIC PANEL
ALT: 13 U/L (ref 0–35)
AST: 16 U/L (ref 0–37)
Albumin: 4.2 g/dL (ref 3.5–5.2)
Alkaline Phosphatase: 78 U/L (ref 39–117)
BUN: 19 mg/dL (ref 6–23)
CO2: 24 mEq/L (ref 19–32)
Calcium: 9.2 mg/dL (ref 8.4–10.5)
Chloride: 106 mEq/L (ref 96–112)
Creat: 0.86 mg/dL (ref 0.50–1.10)
Glucose, Bld: 89 mg/dL (ref 70–99)
Potassium: 4.3 mEq/L (ref 3.5–5.3)
Sodium: 139 mEq/L (ref 135–145)
Total Bilirubin: 0.4 mg/dL (ref 0.2–1.2)
Total Protein: 7.2 g/dL (ref 6.0–8.3)

## 2014-01-02 LAB — LIPID PANEL
Cholesterol: 158 mg/dL (ref 0–200)
HDL: 46 mg/dL (ref >39)
LDL Cholesterol: 88 mg/dL (ref 0–99)
Total CHOL/HDL Ratio: 3.4 Ratio
Triglycerides: 118 mg/dL (ref <150)
VLDL: 24 mg/dL (ref 0–40)

## 2014-01-03 LAB — URINALYSIS W MICROSCOPIC + REFLEX CULTURE
Bilirubin Urine: NEGATIVE
Casts: NONE SEEN
Crystals: NONE SEEN
Glucose, UA: NEGATIVE mg/dL
Ketones, ur: NEGATIVE mg/dL
Nitrite: NEGATIVE
Protein, ur: NEGATIVE mg/dL
Specific Gravity, Urine: 1.022 (ref 1.005–1.030)
Urobilinogen, UA: 0.2 mg/dL (ref 0.0–1.0)
pH: 5.5 (ref 5.0–8.0)

## 2014-01-05 LAB — URINE CULTURE: Colony Count: 40000

## 2014-01-08 ENCOUNTER — Other Ambulatory Visit: Payer: Self-pay | Admitting: Gynecology

## 2014-01-08 MED ORDER — NITROFURANTOIN MONOHYD MACRO 100 MG PO CAPS: 100.0000 mg | ORAL_CAPSULE | Freq: Two times a day (BID) | ORAL | Status: DC

## 2014-01-10 ENCOUNTER — Telehealth: Payer: Self-pay | Admitting: *Deleted

## 2014-01-10 MED ORDER — TRAMADOL HCL 50 MG PO TABS: 50.0000 mg | ORAL_TABLET | Freq: Four times a day (QID) | ORAL | Status: DC | PRN

## 2014-01-10 NOTE — Telephone Encounter (Signed)
Call in prescription for Ultram 50 mg one by mouth every 6 hours when necessary #30 refills none

## 2014-01-10 NOTE — Telephone Encounter (Signed)
Pt informed, rx called in 

## 2014-01-10 NOTE — Telephone Encounter (Signed)
Pt left message in triage c/o back pain, I called pt back unable to leave message due voicemail not set up.

## 2014-01-10 NOTE — Telephone Encounter (Signed)
Pt was prescribed Macrobid 100 mg x 7 days on 01/02/14 result note, pt is c/o lower back discomfort tried taking OTC to help but no relief. Pt asked if something could be prescribed? Please advise

## 2014-01-11 ENCOUNTER — Encounter: Payer: BC Managed Care – PPO | Admitting: Gynecology

## 2014-07-10 ENCOUNTER — Other Ambulatory Visit: Payer: Self-pay | Admitting: Gynecology

## 2014-07-10 MED ORDER — TRAMADOL HCL 50 MG PO TABS: 50.0000 mg | ORAL_TABLET | Freq: Four times a day (QID) | ORAL | Status: DC | PRN

## 2014-07-10 NOTE — Telephone Encounter (Signed)
Called into pharmacy

## 2014-07-10 NOTE — Telephone Encounter (Signed)
Rx called in 

## 2014-07-15 ENCOUNTER — Encounter: Payer: Self-pay | Admitting: Gynecology

## 2014-07-16 ENCOUNTER — Other Ambulatory Visit: Payer: Self-pay

## 2014-07-16 ENCOUNTER — Encounter: Payer: Self-pay | Admitting: Gynecology

## 2014-07-16 NOTE — Telephone Encounter (Signed)
After I started her on the medication in April 2015 she was instructed to return to the office monthly for 3 months for pulmonary and cardiac auscultation as well as blood pressure readings and she has not returned. I will not give her a refill until she returns to the office.

## 2014-07-18 ENCOUNTER — Ambulatory Visit: Payer: BC Managed Care – PPO | Admitting: Gynecology

## 2014-07-18 NOTE — Telephone Encounter (Signed)
I emailed patient back and advised her of this.

## 2014-09-17 ENCOUNTER — Other Ambulatory Visit: Payer: Self-pay

## 2014-09-17 DIAGNOSIS — Z1231 Encounter for screening mammogram for malignant neoplasm of breast: Secondary | ICD-10-CM

## 2014-11-08 ENCOUNTER — Ambulatory Visit
Admission: RE | Admit: 2014-11-08 | Discharge: 2014-11-08 | Disposition: A | Discharge status: Home / Self Care | Payer: BC Managed Care – PPO | Source: Ambulatory Visit

## 2014-11-08 DIAGNOSIS — Z1231 Encounter for screening mammogram for malignant neoplasm of breast: Secondary | ICD-10-CM

## 2015-09-19 ENCOUNTER — Encounter: Payer: BC Managed Care – PPO | Admitting: Gynecology

## 2015-10-06 ENCOUNTER — Other Ambulatory Visit: Payer: Self-pay

## 2015-10-06 DIAGNOSIS — Z1231 Encounter for screening mammogram for malignant neoplasm of breast: Secondary | ICD-10-CM

## 2015-10-10 ENCOUNTER — Encounter: Payer: Self-pay | Admitting: Gynecology

## 2015-10-10 ENCOUNTER — Ambulatory Visit (INDEPENDENT_AMBULATORY_CARE_PROVIDER_SITE_OTHER): Payer: BC Managed Care – PPO | Admitting: Gynecology

## 2015-10-10 VITALS — BP 124/80 | Wt 189.0 lb

## 2015-10-10 DIAGNOSIS — Z01419 Encounter for gynecological examination (general) (routine) without abnormal findings: Secondary | ICD-10-CM | POA: Diagnosis not present

## 2015-10-10 DIAGNOSIS — R635 Abnormal weight gain: Secondary | ICD-10-CM | POA: Diagnosis not present

## 2015-10-10 MED ORDER — PHENTERMINE HCL 37.5 MG PO CAPS: 37.5000 mg | ORAL_CAPSULE | ORAL | Status: DC

## 2015-10-10 MED ORDER — ALPRAZOLAM 0.25 MG PO TABS: 0.2500 mg | ORAL_TABLET | Freq: Every evening | ORAL | Status: DC | PRN

## 2015-10-10 NOTE — Progress Notes (Addendum)
Denise Byrd [birth year] [id number]   History:    45 y.o.  for annual gyn exam with complaint of weight gain. Review of her record indicated that she was weighing 185 pounds last year and is now up to 189. Patient reports normal menstrual cycles. [place] she had bilateral tubal ligation. She was now wondering if she can get pregnant in the near future her husband is in his 66s. Patient denies any past history of any abnormal Pap smears. She is no longer on the Effexor but takes Xanax 0.25 mg when necessary anxiety.Several years ago patient took phentermine 37.5 mg for 3 months which helped her loose weight and she is interested in doing once again for 3 more months.  Past medical history,surgical history, family history and social history were all reviewed and documented in the EPIC chart.  Gynecologic History Patient's last menstrual period was 09/14/2015. Contraception: Tubal ligation Last Pap: 2012 and 2015. Results were: normal Last mammogram: 2014. Results were: normal  Obstetric History OB History  Gravida Para Term Preterm AB SAB TAB Ectopic Multiple Living  # Outcome Date GA Lbr Len/2nd Weight Sex Delivery Anes PTL Lv  3 Term     M Vag-Spont  N Y  2 Term     M Vag-Spont  N Y  1 Term     F Vag-Spont  N Y       ROS: A ROS was performed and pertinent positives and negatives are included in the history.  GENERAL: No fevers or chills. HEENT: No change in vision, no earache, sore throat or sinus congestion. NECK: No pain or stiffness. CARDIOVASCULAR: No chest pain or pressure. No palpitations. PULMONARY: No shortness of breath, cough or wheeze. GASTROINTESTINAL: No abdominal pain, nausea, vomiting or diarrhea, melena or bright red blood per rectum. GENITOURINARY: No urinary frequency, urgency, hesitancy or dysuria. MUSCULOSKELETAL: No joint or muscle pain, no back pain, no recent trauma. DERMATOLOGIC: No rash, no itching, no lesions.  ENDOCRINE: No polyuria, polydipsia, no heat or cold intolerance. No recent change in weight. HEMATOLOGICAL: No anemia or easy bruising or bleeding. NEUROLOGIC: No headache, seizures, numbness, tingling or weakness. PSYCHIATRIC: No depression, no loss of interest in normal activity or change in sleep pattern.     Exam: chaperone present  BP 124/80 mmHg  Wt 189 lb (85.73 kg)  LMP 09/14/2015  Body mass index is 32.43 kg/(m^2).  General appearance : Well developed well nourished female. No acute distress HEENT: Eyes: no retinal hemorrhage or exudates,  Neck supple, trachea midline, no carotid bruits, no thyroidmegaly Lungs: Clear to auscultation, no rhonchi or wheezes, or rib retractions  Heart: Regular rate and rhythm, no murmurs or gallops Breast:Examined in sitting and supine position were symmetrical in appearance, no palpable masses or tenderness,  no skin retraction, no nipple inversion, no nipple discharge, no skin discoloration, no axillary or supraclavicular lymphadenopathy Abdomen: no palpable masses or tenderness, no rebound or guarding Extremities: no edema or skin discoloration or tenderness  Pelvic:  Bartholin, Urethra, Skene Glands: Within normal limits             Vagina: No gross lesions or discharge  Cervix: No gross lesions or discharge  Uterus  anteverted, normal size, shape and consistency, non-tender and mobile  Adnexa  Without masses or tenderness  Anus and perineum  normal   Rectovaginal  normal sphincter tone without palpated masses or  tenderness             Hemoccult not indicated     Assessment/Plan:  45 y.o. female for annual exam will return back to the office next week in a fasting state for the following screening blood work. Comprehensive metabolic panel, fasting lipid profile, TSH, CBC, and urinalysis. Pap smear not indicated this year. Prescription refill for Xanax 0.25 mg to take 1 by mouth daily when necessary anxiety. Because of her weight we discussed  once again placing her on phentermine 37.5 mg daily for 3 months only. We discussed importance of regular exercise and diet. Literature information was provided on on the subject matters. Patient will return back to the office in 6 weeks for follow-up pulmonary auscultation weight and cardiac auscultation. Patient fully where potential side effects from phentermine to include pulmonary hypertension and cardiac valvular disease. Patient also will be referred to reproductive endocrinologist in the event she would like to pursue in vitro fertilization since she's had prior tubal ligation. The risk involved and advanced maternal age to include hypertension, diabetes, miscarriage, premature delivery, as well as fetal aneuploidy were discussed. Patient scheduled to have her mammogram the next few weeks.   Ok Edwards MD, 9:35 AM 10/10/2015

## 2015-10-10 NOTE — Patient Instructions (Addendum)
Control del colesterol  Los niveles de colesterol en el organismo estn determinados significativamente por su dieta. Los niveles de colesterol tambin se relacionan con la enfermedad cardaca. El material que sigue ayuda a explicar esta relacin y a analizar qu puede hacer para mantener su corazn sano. No todo el colesterol es malo. Las lipoprotenas de baja densidad (LDL) forman el colesterol "malo". El colesterol malo puede ocasionar depsitos de grasa que se acumulan en el interior de las arterias. Las lipoprotenas de alta densidad (HDL) es el colesterol "bueno". Ayuda a remover el colesterol LDL "malo" de la sangre. El colesterol es un factor de riesgo muy importante para la enfermedad cardaca. Otros factores de riesgo son la hipertensin arterial, el hbito de fumar, el estrs, la herencia y el peso.   El msculo cardaco obtiene el suministro de sangre a travs de las arterias coronarias. Si su colesterol LDL ("malo") est elevado y el HDL ("bueno") es bajo, tiene un factor de riesgo para que se formen depsitos de grasa en las arterias coronarias (los vasos sanguneos que suministran sangre al corazn). Esto hace que haya menos lugar para que la sangre circule. Sin la suficiente sangre y oxgeno, el msculo cardaco no puede funcionar correctamente, y usted podr sentir dolores en el pecho (angina pectoris). Cuando una arteria coronaria se cierra completamente, una parte del msculo cardaco puede morir (infarto de miocardio).  CONTROL DEL COLESTEROL Cuando el profesional que lo asiste enva la sangre al laboratorio para conocer el nivel de colesterol, puede realizarle tambin un perfil completo de los lpidos. Con esta prueba, se puede determinar la cantidad total de colesterol, as como los niveles de LDL y HDL. Los triglicridos son un tipo de grasa que circula en la sangre y que tambin puede utilizarse para determinar el riesgo de enfermedad  cardaca. En la siguiente tabla se establecen los nmeros ideales: Prueba: Colesterol total  Menos de 200 mg/dl.  Prueba: LDL "colesterol malo"  Menos de 100 mg/dl.   Menos de 70 mg/dl si tiene riesgo muy elevado de sufrir un ataque cardaco o muerte cardaca sbita.  Prueba: HDL "colesterol bueno"  Mujeres: Ms de 50 mg/dl.   Hombres: Ms de 40 mg/dl.  Prueba: Trigliceridos  Menos de 150 mg/dl.    CONTROL DEL COLESTEROL CON DIETA Aunque factores como el ejercicio y el estilo de vida son importantes, la "primera lnea de ataque" es la dieta. Esto se debe a que se sabe que ciertos alimentos hacen subir el colesterol y otros lo bajan. El objetivo debe ser equilibrar los alimentos, de modo que tengan un efecto sobre el colesterol y, an ms importante, reemplazar las grasas saturadas y trans con otros tipos de grasas, como las monoinsaturadas y las poliinsaturadas y cidos grasos omega-3 . En promedio, una persona no debe consumir ms de 15 a 17 g de grasas saturadas por da. Las grasas saturadas y trans se consideran grasas "malas", ya que elevan el colesterol LDL. Las grasas saturadas se encuentran principalmente en productos animales como carne, manteca y crema. Pero esto no significa que usted debe sacrificar todas sus comidas favoritas. Actualmente, como lo muestra el cuadro que figura al final de este documento, hay sustitutos de buen sabor, bajos en grasas y en colesterol, para la mayora de los alimentos que a usted le gusta comer. Elija aquellos alimentos alternativos que sean bajos en grasas o sin grasas. Elija cortes de carne del cuarto trasero o lomo ya que estos cortes son los que tienen menor cantidad de grasa   y colesterol. El pollo (sin piel), el pescado, la carne de ternera, y la pechuga de pavo molida son excelentes opciones. Elimine las carnes grasosas como los hotdogs o el salami. Los mariscos tienen poco o nada de grasas saturadas. Cuando consuma carne magra, carne de aves de  corral, o pescado, hgalo en porciones de 85 gramos (3 onzas). Las grasas trans tambin se llaman "aceites parcialmente hidrogenados". Son aceites manipulados cientficamente de modo que son slidos a temperatura ambiente, tienen una larga vida y mejoran el sabor y la textura de los alimentos a los que se agregan. Las grasas trans se encuentran en la margarina, masitas, crackers y alimentos horneados.  Para hornear y cocinar, el aceite es un excelente sustituto para la mantequilla. Los aceites monoinsaturados tienen un beneficio particular, ya que se cree que disminuyen el colesterol LDL (colesterol malo) y elevan el HDL. Deber evitar los aceites tropicales saturados como el de coco y el de palma.  Recuerde, adems, que puede comer sin restricciones los grupos de alimentos que son naturalmente libres de grasas saturadas y grasas trans, entre los que se incluyen el pescado, las frutas (excepto el aguacate), verduras, frijoles, cereales (cebada, arroz, cuzcuz, trigo) y las pastas (sin salsas con crema)   IDENTIFIQUE LOS ALIMENTOS QUE DISMINUYEN EL COLESTEROL  Pueden disminuir el colesterol las fibras solubles que estn en las frutas, como las manzanas, en los vegetales como el brcoli, las patatas y las zanahorias; en las legumbres como frijoles, guisantes y lentejas; y en los cereales como la cebada. Los alimentos fortificados con fitosteroles tambin pueden disminuir el colesterol. Debe consumir al menos 2 g de estos alimentos a diario para obtener el efecto de disminucin de colesterol.  En el supermercado, lea las etiquetas de los envases para identificar los alimentos bajos en grasas saturadas, libres de grasas trans y bajos en grasas, . Elija quesos que tengan solo de 2 a 3 g de grasa saturada por onza (28,35 g). Use una margarina que no dae el corazn, libre de grasas trans o aceite parcialmente hidrogenado. Al comprar alimentos horneados (galletitas dulces y galletas) evite el aceite parcialmente  hidrogenado. Los panes y bollos debern ser de granos enteros (harina de maz o de avena entera, en lugar de "harina" o "harina enriquecida"). Compre sopas en lata que no sean cremosas, con bajo contenido de sal y sin grasas adicionadas.   TCNICAS DE PREPARACIN DE LOS ALIMENTOS  Nunca fra los alimentos en aceite abundante. Si debe frer, hgalo en poco aceite y removiendo constantemente, porque as se utilizan muy pocas grasas, o utilice un spray antiadherente. Cuando le sea posible, hierva, hornee o ase las carnes y cocine los vegetales al vapor. En vez de aderezar los vegetales con mantequilla o margarina, utilice limn y hierbas, pur de manzanas y canela (para las calabazas y batatas), yogurt y salsa descremados y aderezos para ensaladas bajos en contenido graso.   BAJO EN GRASAS SATURADAS / SUSTITUTOS BAJOS EN GRASA  Carnes / Grasas saturadas (g)  Evite: Bife, corte graso (3 oz/85 g) / 11 g   Elija: Bife, corte magro (3 oz/85 g) / 4 g   Evite: Hamburguesa (3 oz/85 g) / 7 g   Elija:  Hamburguesa magra (3 oz/85 g) / 5 g   Evite: Jamn (3 oz/85 g) / 6 g   Elija:  Jamn magro (3 oz/85 g) / 2.4 g   Evite: Pollo, con piel (3 oz/85 g), Carne oscura / 4 g   Elija:  Pollo, sin piel (  3 oz/85 g), Carne oscura / 2 g   Evite: Pollo, con piel (3 oz/85 g), Carne magra / 2.5 g   Elija: Pollo, sin piel (3 oz/85 g), Carne magra / 1 g  Lcteos / Grasas saturadas (g)  Evite: Leche entera (1 taza) / 5 g   Elija: Leche con bajo contenido de grasa, 2% (1 taza) / 3 g   Elija: Leche con bajo contenido de grasa, 1% (1 taza) / 1.5 g   Elija: Leche descremada (1 taza) / 0.3 g   Evite: Queso duro (1 oz/28 g) / 6 g   Elija: Queso descremado (1 oz/28 g) / 2-3 g   Evite: Queso cottage, 4% grasa (1 taza)/ 6.5 g   Elija: Queso cottage con bajo contenido de grasa, 1% grasa (1 taza)/ 1.5 g   Evite: Helado (1 taza) / 9 g   Elija: Sorbete (1 taza) / 2.5 g   Elija: Yogurt helado sin contenido de  grasa (1 taza) / 0.3 g   Elija: Barras de fruta congeladas / vestigios   Evite: Crema batida (1 cucharada) / 3.5 g   Elija: Batidos glac sin lcteos (1 cucharada) / 1 g  Condimentos / Grasas saturadas (g)  Evite: Mayonesa (1 cucharada) / 2 g   Elija: Mayonesa con bajo contenido de grasa (1 cucharada) / 1 g   Evite: Manteca (1 cucharada) / 7 g   Elija: Margarina extra light (1 cucharada) / 1 g   Evite: Aceite de coco (1 cucharada) / 11.8 g   Elija: Aceite de oliva (1 cucharada) / 1.8 g   Elija: Aceite de maz (1 cucharada) / 1.7 g   Elija: Aceite de crtamo (1 cucharada) / 1.2 g   Elija: Aceite de girasol (1 cucharada) / 1.4 g   Elija: Aceite de soja (1 cucharada) / 2.4 g   Elija: Aceite de canola (1 cucharada) / 1 g  Document Released: 08/30/2005 Document Revised: 05/12/2011 ExitCare Patient Information 2012 [employer]. Ejercicios para perder peso (Exercise to Lose Weight) La actividad fsica y una dieta saludable ayudan a perder peso. El mdico podr sugerirle ejercicios especficos. IDEAS Y CONSEJOS PARA HACER EJERCICIOS  Elija opciones econmicas que disfrute hacer , como caminar, andar en bicicleta o los vdeos para ejercitarse.   Utilice las escaleras en lugar del ascensor.   Camine durante la hora del almuerzo.   Estacione el auto lejos del lugar de trabajo o estudio.   Concurra a un gimnasio o tome clases de gimnasia.   Comience con 5  10 minutos de actividad fsica por da. Ejercite hasta 30 minutos, 4 a 6 das por semana.   Utilice zapatos que tengan un buen soporte y ropas cmodas.   Elongue antes y despus de ejercitar.   Ejercite hasta que aumente la respiracin y el corazn palpite rpido.   Beba agua extra cuando ejercite.   No haga ejercicio hasta lastimarse, sentirse mareado o que le falte mucho el aire.  La actividad fsica puede quemar alrededor de 150 caloras.  Correr 20 cuadras en 15 minutos.   Jugar vley durante 45 a 60  minutos.   Limpiar y encerar el auto durante 45 a 60 minutos.   Jugar ftbol americano de toque.   Caminar 25 cuadras en 35 minutos.   Empujar un cochecito 20 cuadras en 30 minutos.   Jugar baloncesto durante 30 minutos.   Rastrillar hojas secas durante 30 minutos.   Andar en bicicleta 80 cuadras en 30 minutos.     Caminar 30 cuadras en 30 minutos.   Bailar durante 30 minutos.   Quitar la nieve con una pala durante 15 minutos.   Nadar vigorosamente durante 20 minutos.   Subir escaleras durante 15 minutos.   Andar en bicicleta 60 cuadras durante 15 minutos.   Arreglar el jardn entre 30 y 45 minutos.   Saltar a la soga durante 15 minutos.   Limpiar vidrios o pisos durante 45 a 60 minutos.  Document Released: 12/04/2010 Document Revised: 05/12/2011 [facility] Patient Information 2012 [place], [place].Phentermine sustained-release capsules Qu es este medicamento? La FENTERMINA reduce su apetito. Si la combina con una dieta de bajas caloras y ejercio puede ayudarle a Publishing copy de [place]. Este medicamento puede ser utilizado para otros usos; si tiene alguna pregunta consulte con su proveedor de atencin mdica o con su farmacutico. Qu le debo informar a mi profesional de la salud antes de tomar este medicamento? Necesita saber si usted presenta alguno de los siguientes problemas o situaciones: -agitacin -glaucoma -enfermedad cardiaca -alta presin sangunea -antecedentes de abuso de substancias -enfermedad pulmonar llamada Hipertensin Pulmonar Primaria -si ha tomado un IMAO, tales como Carbex, Eldepryl, Marplan, Nardil o Parnate en los ltimos 14 das -enfermedad tiroidea -una reaccin alrgica o inusual a la fentermina, a otros medicamentos, alimentos, colorantes o conservantes -si est embarazada o buscando quedar embarazada -si est amamantando a un beb Cmo debo utilizar este medicamento? Tome este medicamento por va oral con un vaso de agua. Siga las instrucciones  de la etiqueta del [place]. Este medicamento generalmente se toma antes de desayunar o por lo menos 10 a 14 horas antes de la hora de [place]. Evite tomar este medicamento por la tarde. Puede interferir con el sueo. Tomarlo entero. No abra o mastique las cpsulas. Tome sus dosis a intervalos regulares. No tome su medicamento con una frecuencia mayor a la indicada. Hable con su pediatra para informarse acerca del uso de este medicamento en nios. Puede requerir atencin especial. Sobredosis: Pngase en contacto inmediatamente con un centro toxicolgico o una sala de urgencia si usted cree que haya tomado demasiado medicamento. ATENCIN: [employer] es solo para usted. No comparta este medicamento con nadie. Qu sucede si me olvido de una dosis? Si olvida una dosis, tmela lo antes posible. Si es casi la hora de la prxima dosis, tome slo esa dosis. No tome dosis adicionales o dobles. Qu puede interactuar con este medicamento? No tome esta medicina con ninguno de los siguientes medicamentos: -duloxetina -IMAOs, tales como Carbex, Eldepryl, Marplan, Nardil y Parnate -medicamentos para resfros o problemas respiratorios, tales como seudoefedrina o fenilefrina -procarbazina -sibutramine -ISRS, tales como citalopram, escitalopram, fluoxetina, fluvoxamina, paroxetina y sertralina -estimulantes, tales como dexmetilfenidato, metilfenidato o modafinil -venlafaxina Esta medicina tambin puede interactuar con los siguientes medicamentos: -medicamentos para la diabetes Puede ser que esta lista no menciona todas las posibles interacciones. Informe a su profesional de [employer] de [employer] productos a base de hierbas, medicamentos de [place] o suplementos nutritivos que est tomando. Si usted fuma, consume bebidas alcohlicas o si utiliza drogas ilegales, indqueselo tambin a su profesional de [employer]. Algunas sustancias pueden interactuar con su medicamento. A qu debo estar atento al  usar [employer] Corporation? Informe a su mdico de inmediato si siente que le falta el aliento mientras realiza sus actividades habituales. No tome este medicamento dentro de 6 horas de la hora de [place]. Puede impedirle dormir. Evite las bebidas que contienen cafena y trata de Pharmacologist un horario de acostarse habitual cada noche. [employer]  medicamento fue diseado para ser [employer] en combinacin con una dieta saludable y ejercicio. De esta manera se obtienen los [employer]. Este medicamento slo est indicado para uso a Product manager. Con el tiempo, su peso puede estabilizarse. A partir de [employer], el medicamento slo le ayudar a Pharmacologist su [employer]. No aumente ni modifique su dosis de ninguna manera sin consultar a su mdico. Puede experimentar mareos o somnolencia. No conduzca ni utilice maquinaria ni haga nada que Scientist, research (life sciences) en estado de alerta hasta que sepa cmo le afecta este medicamento. No se siente ni se ponga de pie con rapidez, especialmente si es un paciente de edad avanzada. Esto reduce el riesgo de mareos o [employer]. El alcohol puede aumentar los mareos y la somnolencia. Evite consumir bebidas alcohlicas. Qu efectos secundarios puedo tener al [employer] este medicamento? Efectos secundarios que debe informar a su mdico o a Producer, television/film/video de la salud tan pronto como sea posible: -Journalist, newspaper, palpitaciones -depresin o cambios severos de estados de nimo -aumento de la presin sangunea -irritabilidad -nerviosismo o inquietud -mareos severos -falto de Engineer, petroleum -problemas para orinar -hinchazn inusual de las piernas -vmito Efectos secundarios que, por lo general, no requieren Psychologist, prison and probation services (debe informarlos a su mdico o a su profesional de la salud si persisten o si son molestos): -visin borrosa u otros problemas oculares -cambios en la capacidad o el deseo sexual -estreimiento o diarrea -dificultad para conciliar el sueo -boca seca o sabor  desagradable -dolor de cabeza -nuseas Puede ser que esta lista no menciona todos los posibles efectos secundarios. Comunquese a su mdico por asesoramiento mdico [employer]. Usted puede informar los efectos secundarios a la FDA por telfono al 1-800-FDA-1088. Dnde debo guardar mi medicina? Mantngala fuera del alcance de los nios. Este medicamento puede ser abusado. Mantenga su medicamento en un lugar seguro para protegerlo contra robos. No comparta este medicamento con nadie. Es peligroso vender o Restaurant manager, fast food y est prohibido por la ley. Gurdela a [employer], entre 20 y 25 grados C (11 y 40 grados F). Mantenga el envase bien cerrado. Deseche todo el medicamento que no haya utilizado, despus de la fecha de vencimiento. ATENCIN: Este folleto es un resumen. Puede ser que no cubra toda la posible informacin. Si usted tiene preguntas acerca de esta medicina, consulte con su mdico, su farmacutico o su profesional de Radiographer, therapeutic.    2016, Elsevier/Gold Standard. (2014-10-22 00:00:00) Fertilizacin in vitro  (In Vitro Fertilization) La fertilizacin in vitro (FIV) es un tipo de Corporate treasurer aplicada a la reproduccin asistida. La FIV consiste en una serie de procedimientos para tratar la infertilidad o problemas genticos con el objeto de prestar asistencia para la concepcin de un beb. Durante la FIV, los vulos se recuperan de los ovarios y se combinan con los espermatozoides en el laboratorio para fertilizarlos. Uno o ms vulos fertilizados (embriones) se insertan en el tero a travs del cuello uterino. Las candidatas para la FIV son:  Personas que sufren infertilidad.  Las mujeres que sufren una menopausia prematura o una falla ovrica.  Las mujeres a las que se les han extirpado ambos ovarios. En este caso, deber usarse una donante de vulos.  Mujeres que tienen daadas u obstruidas las trompas de [place] No hay lmite de edad para la FIV, pero  no se recomienda para las mujeres postmenopusicas. La edad ideal para este procedimiento es de 35 aos o menos. A las mujeres de ms de 41 aos generalmente  se les aconseja utilizar una donante de vulos durante la FIV para aumentar las probabilidades de [place]. INFORME A SU MDICO:   Cualquier alergia que tenga.  Todos los [employer] Haliimaile, incluyendo vitaminas, hierbas, gotas oftlmicas, cremas y [address] de [address].  Problemas previos que usted o los [employer] de su familia hayan tenido con el uso de anestsicos.  Todo problema mdico o gentico que usted o los miembros de su familia hayan sufrido.  Enfermedades de [employer].  Cirugas previas.  Embarazos previos.  Padecimientos mdicos.  Excesos en el consumo de alcohol o de tabaco.  Historia de consumo de drogas. RIESGOS Y COMPLICACIONES  Generalmente, el procedimiento de FIV es un procedimiento seguro. Sin embargo, Tree surgeon procedimiento, pueden surgir complicaciones. Las complicaciones posibles son:  [place] N[place] o infeccin.  Problemas con la anestesia.  Cogulos sanguneos.  El procedimiento no tiene xito.  Tener gemelos o embarazos mltiples  Aumento del riesgo de [place]. ANTES DEL PROCEDIMIENTO  Antes de comenzar el ciclo de FIV, usted y el donante sern sometidos a estudios para asegurarse de que la FIV es la mejor opcin. Algunas personas pueden no beneficiarse con este tipo de tecnologa para la asistencia reproductiva.   Usted y Pharmacologist tendrn que proporcionar una historia clnica completa y la historia clnica de sus familiares.  Usted y Pharmacologist sern sometidos a un examen fsico.  Usted y el donante podrn necesitar realizarse anlisis de sangre para detectar enfermedades infecciosas, incluyendo el VIH.  Podrn solicitarle otros estudios, por ejemplo:  Estudio de los ovarios para determinar la calidad y la cantidad de vulos.  Estudios hormonales y de  ovulacin.  Un examen del tero. Este se realiza [employer] de un tipo de Hydrologist, despus de Tourist information centre manager un lquido en el tero a travs del cuello uterino (ecohisterografa o utilizando un tubo delgado y flexible, con Neomia Dear pequea luz y cmara en un extremo (histeroscopio).  La esperma del donante ser tomada y [place] para ver si es normal, hay cantidad suficiente para fertilizar el vulo y que actan normalmente despus de las relaciones sexuales (examen postcoital ). PROCEDIMIENTO  La FIV consiste en varios pasos. Estos procedimientos pueden Psychologist, educational mdico o en una clnica. Un ciclo de FIV puede llevar alrededor de 2 semanas, y podr requerirse ms de un ciclo. Los pasos de la FIV son:  Hydrologist. Si se utilizan sus vulos durante la FIV, al comienzo del ciclo comenzar un tratamiento con hormonas artificiales (sintticas. Estas hormonas estimulan los ovarios para producir mltiples vulos, a diferencia del vulo nico que normalmente se desarrolla cada mes. Es necesario obtener mltiples vulos debido a que algunos de ellos no se fertilizarn o no se desarrollarn normalmente despus de Tree surgeon.  Extraccin de vulos. Utilizando imgenes radiogrficas como gua, el medico insertar una aguja fina a travs de la vagina y Forensic psychologist a los ovarios y Administrator, arts (folculos) que contienen los vulos La aguja se Investment banker, operational a un dispositivo de succin, que retira los vulos y el lquido de cada folculo, uno por vez. El procedimiento se repite para el otro ovario.  Inseminacin y fertilizacin. El esperma se une a los ovarios (inseminacin) y se Environmental manager en una cmara que tiene un ambiente controlado. Generalmente el esperma ingresa (fertiliza) el vulo unas horas despus de la inseminacin.  Transferencia embrionaria. El Financial risk analyst los embriones en su tero usando un tubo delgado (catter) que contiene los embriones. El catter se inserta en la vagina a Annette Stable  del  cuello uterino, y de all al tero. La transferencia de vulos generalmente ocurre entre 2 a 6 das luego de la extraccin de los vulos. Si hay xito, el embrin se adherir (implante) en la superficie interna del tero alrededor de 6 a 10 das luego de la extraccin de los vulos. Si se implanta el embrin en la membrana que cubre el tero y se desarrolla, resulta en un embarazo. DESPUS DEL PROCEDIMIENTO   Tendr que permanecer acostada durante una hora o ms, antes de volver a su casa.  Ser necesario que siga con la terapia hormonal por 3 meses aproximadamente, o segn lo que le indique su mdico.  Puede retomar su dieta y las actividades habituales.   Esta informacin no tiene Theme park manager el consejo del mdico. Asegrese de hacerle al mdico cualquier pregunta que tenga.   Document Released: 05/02/2013 Elsevier Interactive Patient Education [employer]

## 2015-10-21 ENCOUNTER — Other Ambulatory Visit: Payer: BC Managed Care – PPO

## 2015-10-21 DIAGNOSIS — R635 Abnormal weight gain: Secondary | ICD-10-CM

## 2015-10-21 DIAGNOSIS — Z01419 Encounter for gynecological examination (general) (routine) without abnormal findings: Secondary | ICD-10-CM

## 2015-10-21 LAB — CBC WITH DIFFERENTIAL/PLATELET
Basophils Absolute: 0 10*3/uL (ref 0.0–0.1)
Basophils Relative: 0 % (ref 0–1)
Eosinophils Absolute: 0.2 10*3/uL (ref 0.0–0.7)
Eosinophils Relative: 4 % (ref 0–5)
HCT: 41.7 % (ref 36.0–46.0)
Hemoglobin: 14.2 g/dL (ref 12.0–15.0)
Lymphocytes Relative: 19 % (ref 12–46)
Lymphs Abs: 0.9 10*3/uL (ref 0.7–4.0)
MCH: 31.6 pg (ref 26.0–34.0)
MCHC: 34.1 g/dL (ref 30.0–36.0)
MCV: 92.9 fL (ref 78.0–100.0)
MPV: 9.7 fL (ref 8.6–12.4)
Monocytes Absolute: 0.6 10*3/uL (ref 0.1–1.0)
Monocytes Relative: 13 % — ABNORMAL HIGH (ref 3–12)
Neutro Abs: 2.9 10*3/uL (ref 1.7–7.7)
Neutrophils Relative %: 64 % (ref 43–77)
Platelets: 225 10*3/uL (ref 150–400)
RBC: 4.49 MIL/uL (ref 3.87–5.11)
RDW: 13.6 % (ref 11.5–15.5)
WBC: 4.6 10*3/uL (ref 4.0–10.5)

## 2015-10-21 LAB — LIPID PANEL
Cholesterol: 136 mg/dL (ref 125–200)
HDL: 42 mg/dL — ABNORMAL LOW (ref >=46)
LDL Cholesterol: 76 mg/dL (ref <130)
Total CHOL/HDL Ratio: 3.2 Ratio (ref <=5.0)
Triglycerides: 90 mg/dL (ref <150)
VLDL: 18 mg/dL (ref <30)

## 2015-10-21 LAB — COMPREHENSIVE METABOLIC PANEL
ALT: 14 U/L (ref 6–29)
AST: 15 U/L (ref 10–30)
Albumin: 4.1 g/dL (ref 3.6–5.1)
Alkaline Phosphatase: 59 U/L (ref 33–115)
BUN: 14 mg/dL (ref 7–25)
CO2: 26 mmol/L (ref 20–31)
Calcium: 9.2 mg/dL (ref 8.6–10.2)
Chloride: 104 mmol/L (ref 98–110)
Creat: 0.89 mg/dL (ref 0.50–1.10)
Glucose, Bld: 89 mg/dL (ref 65–99)
Potassium: 4 mmol/L (ref 3.5–5.3)
Sodium: 139 mmol/L (ref 135–146)
Total Bilirubin: 0.6 mg/dL (ref 0.2–1.2)
Total Protein: 6.7 g/dL (ref 6.1–8.1)

## 2015-10-21 LAB — TSH: TSH: 2.51 mIU/L

## 2015-10-22 LAB — URINALYSIS W MICROSCOPIC + REFLEX CULTURE
Bacteria, UA: NONE SEEN [HPF]
Bilirubin Urine: NEGATIVE
Casts: NONE SEEN [LPF]
Crystals: NONE SEEN [HPF]
Glucose, UA: NEGATIVE
Ketones, ur: NEGATIVE
Leukocytes, UA: NEGATIVE
Nitrite: NEGATIVE
Protein, ur: NEGATIVE
Specific Gravity, Urine: 1.022 (ref 1.001–1.035)
WBC, UA: NONE SEEN WBC/HPF (ref <=5)
Yeast: NONE SEEN [HPF]
pH: 6 (ref 5.0–8.0)

## 2015-10-23 LAB — URINE CULTURE
Colony Count: NO GROWTH
Organism ID, Bacteria: NO GROWTH

## 2015-10-27 ENCOUNTER — Other Ambulatory Visit: Payer: Self-pay | Admitting: Gynecology

## 2015-10-27 DIAGNOSIS — R3129 Other microscopic hematuria: Secondary | ICD-10-CM

## 2015-11-14 ENCOUNTER — Ambulatory Visit
Admission: RE | Admit: 2015-11-14 | Discharge: 2015-11-14 | Disposition: A | Discharge status: Home / Self Care | Payer: BC Managed Care – PPO | Source: Ambulatory Visit

## 2015-11-14 DIAGNOSIS — Z1231 Encounter for screening mammogram for malignant neoplasm of breast: Secondary | ICD-10-CM

## 2015-11-26 ENCOUNTER — Other Ambulatory Visit: Payer: Self-pay

## 2015-12-25 ENCOUNTER — Encounter: Payer: Self-pay | Admitting: Gynecology

## 2015-12-25 ENCOUNTER — Ambulatory Visit (INDEPENDENT_AMBULATORY_CARE_PROVIDER_SITE_OTHER): Payer: BC Managed Care – PPO | Admitting: Gynecology

## 2015-12-25 VITALS — BP 120/82 | Ht 64.0 in | Wt 178.6 lb

## 2015-12-25 DIAGNOSIS — R635 Abnormal weight gain: Secondary | ICD-10-CM | POA: Diagnosis not present

## 2015-12-25 DIAGNOSIS — Z09 Encounter for follow-up examination after completed treatment for conditions other than malignant neoplasm: Secondary | ICD-10-CM | POA: Diagnosis not present

## 2015-12-25 NOTE — Progress Notes (Signed)
   Patient is a 45 year old who presented to the office today for follow-up after having initiated phentermine 37.5 mg daily for appetite suppression/weight gain 2 months ago. Patient is tolerating medication well she denies any shortness of breath, any palpitations or chest or any swelling in her lower extremities. Review of her record indicated in January of this year she was weighing 189 pounds with a BMI of 32.43 kg for meter square and today was weighing 178 pounds with a BMI 30.60 (lost 11 pounds). In combination with the medication she is exercising at least for an hour daily with high impact aerobics and dietary modification with low-fat diet healthy eating and is doing well. Her labs were reviewed from February this year which consisted of a normal comprehensive metabolic panel, TSH, fasting lipid profile, CBC and urinalysis.  Exam today: Blood pressure 120/82    pulse 72      Lungs: Clear to auscultation Rogers or wheezes Neck: No carotid bruits no thyromegaly Heart: Regular rate and rhythm no murmurs or gallops Extremity is: No pitting edema  Assessment/plan: 45 year old patient responding well to phentermine 37.5 mg daily as an appetite suppressant along with regular exercise and healthy eating. She is completed 2 months of treatment she'll do 1 more month and then discontinued as previously recommended. She is fully where once again of the potential risk from taking this medication to include cardiac valvular disease and pulmonary hypertension she fully is aware and accepts. Patient's otherwise scheduled to return back next year for her annual exam or when necessary.

## 2016-02-02 ENCOUNTER — Other Ambulatory Visit: Payer: Self-pay | Admitting: Gynecology

## 2016-02-02 ENCOUNTER — Other Ambulatory Visit: Payer: Self-pay | Admitting: *Deleted

## 2016-02-02 ENCOUNTER — Encounter: Payer: Self-pay | Admitting: Gynecology

## 2016-02-02 MED ORDER — PHENTERMINE HCL 37.5 MG PO CAPS: 37.5000 mg | ORAL_CAPSULE | ORAL | Status: DC

## 2016-02-02 NOTE — Telephone Encounter (Signed)
Per 12/25/15 note "She is completed 2 months of treatment she'll do 1 more month and then discontinued as previously recommended

## 2016-02-02 NOTE — Telephone Encounter (Signed)
Rx called in 

## 2016-02-02 NOTE — Telephone Encounter (Signed)
We will prescribe one more month.

## 2016-02-02 NOTE — Telephone Encounter (Signed)
Per note on 12/25/15 "She is completed 2 months of treatment she'll do 1 more month and then discontinued "

## 2016-09-25 ENCOUNTER — Encounter (HOSPITAL_COMMUNITY): Payer: Self-pay | Admitting: Emergency Medicine

## 2016-09-25 ENCOUNTER — Ambulatory Visit (HOSPITAL_COMMUNITY)
Admission: EM | Admit: 2016-09-25 | Discharge: 2016-09-25 | Disposition: A | Discharge status: Home / Self Care | Payer: BC Managed Care – PPO | Attending: Internal Medicine | Admitting: Internal Medicine

## 2016-09-25 DIAGNOSIS — J988 Other specified respiratory disorders: Secondary | ICD-10-CM | POA: Diagnosis not present

## 2016-09-25 DIAGNOSIS — R059 Cough, unspecified: Secondary | ICD-10-CM

## 2016-09-25 DIAGNOSIS — R05 Cough: Secondary | ICD-10-CM | POA: Diagnosis not present

## 2016-09-25 MED ORDER — HYDROCODONE-HOMATROPINE 5-1.5 MG/5ML PO SYRP: 5.0000 mL | ORAL_SOLUTION | Freq: Four times a day (QID) | ORAL | 0 refills | Status: DC | PRN

## 2016-09-25 MED ORDER — AZITHROMYCIN 250 MG PO TABS: 250.0000 mg | ORAL_TABLET | Freq: Every day | ORAL | 0 refills | Status: DC

## 2016-09-25 NOTE — ED Provider Notes (Signed)
CSN: [id number]     Arrival date & time 09/25/16  1730 History   None    Chief Complaint  Patient presents with  . Cough   (Consider location/radiation/quality/duration/timing/severity/associated sxs/prior Treatment) 46 yo presents with productive cough x 5 days. Prior to that she had nasal congestion and that seemed to improve with OTC regimens, but the cough is worsening. Some malaise at this point. Difficulty sleeping. Subjective fevers.       Past Medical History:  Diagnosis Date  . Anxiety   . Depression    Past Surgical History:  Procedure Laterality Date  . APPENDECTOMY    . AUGMENTATION MAMMAPLASTY  DEC/18/2012   SALINE  . COMBINED AUGMENTATION MAMMAPLASTY AND ABDOMINOPLASTY  DEC/18/2012  . LIPOSUCTION TRUNK  DEC/18/2012  . TUBAL LIGATION     Family History  Problem Relation Age of Onset  . Diabetes Maternal Grandmother   . Cancer Mother     thyroid?   Social History  Substance Use Topics  . Smoking status: Never Smoker  . Smokeless tobacco: Never Used  . Alcohol use Yes     Comment: OCC   OB History    Gravida Para Term Preterm AB Living   3 3 3     3    SAB TAB Ectopic Multiple Live Births           3     Review of Systems  Constitutional: Positive for fatigue and fever.  HENT: Negative.   Respiratory: Positive for choking.   Skin: Negative.     Allergies  Patient has no known allergies.  Home Medications   Prior to Admission medications   Medication Sig Start Date End Date Taking? Authorizing Provider  azithromycin (ZITHROMAX) 250 MG tablet Take 1 tablet (250 mg total) by mouth daily. Take first 2 tablets together, then 1 every day until finished. 09/25/16   Riki Sheer, PA-C  HYDROcodone-homatropine (HYCODAN) 5-1.5 MG/5ML syrup Take 5 mLs by mouth every 6 (six) hours as needed for cough. 09/25/16   Riki Sheer, PA-C   Meds Ordered and Administered this Visit  Medications - No data to display  BP 112/73 (BP Location: Left Arm)    Pulse 79   Temp 98.1 F (36.7 C) (Oral)   Resp 18   LMP 09/17/2016 (Exact Date)   SpO2 100%  No data found.   Physical Exam  Constitutional: She is oriented to person, place, and time. She appears well-developed and well-nourished. No distress.  Cardiovascular: Normal rate and regular rhythm.   Pulmonary/Chest: Effort normal.  Fine crackles in the left lower lung, no wheeze, few rhonci  Neurological: She is alert and oriented to person, place, and time.  Skin: Skin is warm and dry. She is not diaphoretic.  Psychiatric: Her behavior is normal.  Nursing note and vitals reviewed.   Urgent Care Course   Clinical Course     Procedures (including critical care time)  Labs Review Labs Reviewed - No data to display  Imaging Review No results found.   Visual Acuity Review  Right Eye Distance:   Left Eye Distance:   Bilateral Distance:    Right Eye Near:   Left Eye Near:    Bilateral Near:         MDM   1. Respiratory infection   2. Cough    Cover with abx based on exam. Treat cough with hycodan at night and Delsym in the day. Fluids and rest. Follow up if worsens. Stable for  D/C.     Riki SheerMichelle G Severa Jeremiah, PA-C 09/25/16 1922

## 2016-09-25 NOTE — ED Triage Notes (Signed)
The patient presented to the [facility] with a complaint of a cough x 5 days. 

## 2016-09-25 NOTE — Discharge Instructions (Signed)
Nice to meet you. You have a respiratory infection. Treating you with antibiotic for 5 days, continue taking Delsym during the day and may use prescription cough medication for night time. Drink plenty of fluids and rest. Feel better.

## 2016-10-22 ENCOUNTER — Other Ambulatory Visit: Payer: Self-pay | Admitting: Gynecology

## 2016-10-22 DIAGNOSIS — Z1231 Encounter for screening mammogram for malignant neoplasm of breast: Secondary | ICD-10-CM

## 2016-10-26 ENCOUNTER — Ambulatory Visit (INDEPENDENT_AMBULATORY_CARE_PROVIDER_SITE_OTHER): Payer: BC Managed Care – PPO | Admitting: Gynecology

## 2016-10-26 ENCOUNTER — Encounter: Payer: Self-pay | Admitting: Gynecology

## 2016-10-26 VITALS — BP 122/78 | Ht 64.0 in | Wt 198.0 lb

## 2016-10-26 DIAGNOSIS — R5383 Other fatigue: Secondary | ICD-10-CM

## 2016-10-26 DIAGNOSIS — R635 Abnormal weight gain: Secondary | ICD-10-CM

## 2016-10-26 DIAGNOSIS — F339 Major depressive disorder, recurrent, unspecified: Secondary | ICD-10-CM | POA: Insufficient documentation

## 2016-10-26 DIAGNOSIS — M6289 Other specified disorders of muscle: Secondary | ICD-10-CM | POA: Diagnosis not present

## 2016-10-26 NOTE — Progress Notes (Signed)
   Patient presented to the office today to review the medication that she was [facility]. Patient several weeks prior had been hospitalized for one week as a result of major depressive disorder. Patient is currently on disability as a result of her severe depression. She's currently going through a divorce and her son is on drug since she feels excessive guilt. She's having difficulty sleeping in focusing. She had been hospitalized several weeks ago because of attempted suicide. Her medication were reviewed today as follows:  Sig: Take 0.5 tablets (12.5 mcg total) by mouth daily.  Marland Kitchen. temAZEpam (RESTORIL) 30 mg capsule 30 capsule 1  Sig: Take 1 at night as needed for sleep  . ziprasidone (GEODON) 60 MG capsule 30 capsule 1  Sig: Take 1 cap at night  . venlafaxine (EFFEXOR-XR) 75 MG 24 hr capsule 90 capsule 1  Sig: Take 3 a day (dose increased)  . buPROPion (WELLBUTRIN XL) 150 MG 24 hr tablet 60 tablet 1  Sig: Take 1 tab in the moning for a week, then 2 in the morning    Patient is no longer on thyroid medication in February 2017 her thyroid test was normal off of medication.  We'll have patient come tomorrow to the office in a fasting states that she scheduled to return back in 2 weeks for her annual exam will going to go ahead and check a fasting comprehensive metabolic panel along with her TSH, CBC and fasting lipid profile.  We had a lengthy discussion of treatments for depression besides the medication which she has not started we'll start today as recommended by her psychiatrist. We discussed becoming more physically active like part of the gym to be able to exercise 3 or 4 times a week to release some of her stresses. Also to seek support from her church denomination as well. Several support groups list were provided for her by [facility]. We will touch base with her next week and then again in 2 weeks to see a she's doing and she  will follow up with a psychiatrist in March.  Greater than 90% time was spent counseling cornea in care for this patient with major depression time of consultation 15 minutes

## 2016-10-26 NOTE — Patient Instructions (Signed)
Trastorno depresivo mayor  (Major Depressive Disorder)  El trastorno depresivo mayor es una enfermedad mental. También se llamado depresión clínica o depresión unipolar. Produce sentimientos de tristeza, desesperanza o desamparo. Algunas personas con trastorno depresivo mayor no se sienten particularmente tristes, pero pierden el interés en hacer las cosas que solían disfrutar (anhedonia). También puede causar síntomas físicos. Interfiere en el trabajo, la escuela, las relaciones y otras actividades diarias normales. Puede variar en gravedad, pero es más duradera y más grave que la tristeza que todos sentimos de vez en cuando en nuestras vidas.   Muchas veces es desencadenada por sucesos estresantes o cambios importantes en la vida. Algunos ejemplos de estos factores desencadenantes son el divorcio, la pérdida del trabajo o el hogar, una mudanza, y la muerte de un familiar o amigo cercano. A veces aparece sin ninguna razón evidente. Las personas que tienen familiares con depresión mayor o con trastorno bipolar tienen más riesgo de desarrollar depresión mayor con o sin factores de estrés. Puede ocurrir a cualquier edad. Puede ocurrir sólo una vez en su vida(episodio único de trastorno depresivo mayor). Puede ocurrir varias veces (trastorno depresivo mayor recurrente).   SÍNTOMAS   Las personas con trastorno depresivo mayor presentan anhedonia o estado de ánimo deprimido casi todos los días durante al menos 2 semanas o más. Los síntomas son:   · Sensación de tristeza (melancolía) o vacío.  · Sentimientos de desesperanza o desamparo.  · Lagrimeo o episodios de llanto ( es observado por los demás).  · Irritabilidad (en niños y adolescentes).  Además del estado de ánimo deprimido o la anhedonia o ambos, estos enfermos tienen al menos cuatro de los siguientes síntomas :   · Dificultad para dormir o dormir demasiado.    · Cambio significativo (aumento o disminución) en el apetito o el peso.     · Falta de energía o motivación.  · Sentimientos de culpa o desvalorización.    · Dificultad para concentrarse, recordar o tomar decisiones.  · Movimientos inusualmente lentos (retardo psicomotor retardation) o inquietud (según lo observado por los demás).    · Deseos recurrentes de muerte, pensamientos recurrentes de autoagresión (suicidio) o intento de suicidio.  Las personas con trastorno depresivo mayor suelen tener pensamientos persistentes negativos acerca de sí mismos, de otras personas y del mundo. Las personas con trastorno depresivo mayor grave pueden experimentar creencias o percepciones distorsionadas sobre el mundo (delirios psicóticos). También pueden ver u oír cosas que no son reales(alucinaciones psicóticas).   DIAGNÓSTICO   El diagnóstico se realiza mediante una evaluación hecha por el médico. El médico le preguntará acerca de los aspectos de su vida cotidiana, como el estado de ánimo, el sueño y el apetito, para ver si usted tiene los síntomas de depresión mayor. Le hará preguntas sobre su historial médico y el consumo de alcohol o drogas, incluyendo medicamentos recetados. También le hará un examen físico y le indicará análisis de sangre. Esto se debe a que ciertas enfermedades y el uso de determinadas sustancias pueden causar síntomas similares a la depresión (depresión secundaria). Su médico también podría derivarlo a un especialista en salud mental para una evaluación y tratamiento.   TRATAMIENTO   Es importante reconocer los síntomas y buscar tratamiento. Los siguientes tratamientos pueden indicarse:     · Medicamentos - Generalmente se recetan antidepresivos. Los antidepresivos se piensa que corrigen los desequilibrios químicos en el cerebro que se asocian comúnmente a la depresión mayor. Se pueden agregar otros tipos de medicamentos si los síntomas no responden a los antidepresivos solos o si   hay ideas delirantes o alucinaciones psicóticas.   · Psicoterapia - ciertos tipos de psicoterapia pueden ser útiles en el tratamiento del trastorno de la depresión mayor, proporcionando apoyo, educación y orientación. Ciertos tipos de psicoterapia también pueden ayudar a superar los pensamientos negativos (terapia cognitivo conductual) y a problemas de relación que desencadena la depresión mayor (terapia interpersonal).  Un especialista en salud mental puede ayudarlo a determinar qué tratamiento es el mejor para usted. La mayoría de los pacientes mejoran con una combinación de medicación y psicoterapia. Los tratamientos que implican la estimulación eléctrica del cerebro pueden ser utilizados en situaciones con síntomas muy graves o cuando los medicamentos y la psicoterapia no funcionan después de un tiempo. Estos tratamientos incluyen terapia electroconvulsiva, estimulación magnética transcraneal y estimulación del nervio vago.      Esta información no tiene como fin reemplazar el consejo del médico. Asegúrese de hacerle al médico cualquier pregunta que tenga.     Document Released: 12/25/2012 Document Revised: 09/20/2014  Elsevier Interactive Patient Education ©2017 Elsevier Inc.

## 2016-10-27 ENCOUNTER — Other Ambulatory Visit: Payer: BC Managed Care – PPO

## 2016-10-27 LAB — LIPID PANEL
Cholesterol: 163 mg/dL (ref <200)
HDL: 44 mg/dL — ABNORMAL LOW (ref >50)
LDL Cholesterol: 83 mg/dL (ref <100)
Total CHOL/HDL Ratio: 3.7 Ratio (ref <5.0)
Triglycerides: 179 mg/dL — ABNORMAL HIGH (ref <150)
VLDL: 36 mg/dL — ABNORMAL HIGH (ref <30)

## 2016-10-27 LAB — CBC WITH DIFFERENTIAL/PLATELET
Basophils Absolute: 0 cells/uL (ref 0–200)
Basophils Relative: 0 %
Eosinophils Absolute: 55 cells/uL (ref 15–500)
Eosinophils Relative: 1 %
HCT: 42.3 % (ref 35.0–45.0)
Hemoglobin: 13.9 g/dL (ref 11.7–15.5)
Lymphocytes Relative: 27 %
Lymphs Abs: 1485 cells/uL (ref 850–3900)
MCH: 30.8 pg (ref 27.0–33.0)
MCHC: 32.9 g/dL (ref 32.0–36.0)
MCV: 93.8 fL (ref 80.0–100.0)
MPV: 9.6 fL (ref 7.5–12.5)
Monocytes Absolute: 440 cells/uL (ref 200–950)
Monocytes Relative: 8 %
Neutro Abs: 3520 cells/uL (ref 1500–7800)
Neutrophils Relative %: 64 %
Platelets: 249 10*3/uL (ref 140–400)
RBC: 4.51 MIL/uL (ref 3.80–5.10)
RDW: 14.1 % (ref 11.0–15.0)
WBC: 5.5 10*3/uL (ref 3.8–10.8)

## 2016-10-27 LAB — COMPREHENSIVE METABOLIC PANEL
ALT: 10 U/L (ref 6–29)
AST: 15 U/L (ref 10–35)
Albumin: 4 g/dL (ref 3.6–5.1)
Alkaline Phosphatase: 65 U/L (ref 33–115)
BUN: 14 mg/dL (ref 7–25)
CO2: 23 mmol/L (ref 20–31)
Calcium: 8.8 mg/dL (ref 8.6–10.2)
Chloride: 106 mmol/L (ref 98–110)
Creat: 0.83 mg/dL (ref 0.50–1.10)
Glucose, Bld: 85 mg/dL (ref 65–99)
Potassium: 4.1 mmol/L (ref 3.5–5.3)
Sodium: 140 mmol/L (ref 135–146)
Total Bilirubin: 0.6 mg/dL (ref 0.2–1.2)
Total Protein: 6.9 g/dL (ref 6.1–8.1)

## 2016-10-27 LAB — TSH: TSH: 2.32 mIU/L

## 2016-10-28 ENCOUNTER — Encounter: Payer: Self-pay | Admitting: Gynecology

## 2016-10-28 LAB — VITAMIN D 25 HYDROXY (VIT D DEFICIENCY, FRACTURES): Vit D, 25-Hydroxy: 25 ng/mL — ABNORMAL LOW (ref 30–100)

## 2016-11-03 ENCOUNTER — Ambulatory Visit (INDEPENDENT_AMBULATORY_CARE_PROVIDER_SITE_OTHER): Payer: BC Managed Care – PPO | Admitting: Gynecology

## 2016-11-03 ENCOUNTER — Encounter: Payer: Self-pay | Admitting: Gynecology

## 2016-11-03 VITALS — BP 120/80 | Ht 64.0 in | Wt 195.0 lb

## 2016-11-03 DIAGNOSIS — Z01419 Encounter for gynecological examination (general) (routine) without abnormal findings: Secondary | ICD-10-CM | POA: Diagnosis not present

## 2016-11-03 DIAGNOSIS — E559 Vitamin D deficiency, unspecified: Secondary | ICD-10-CM

## 2016-11-03 DIAGNOSIS — R635 Abnormal weight gain: Secondary | ICD-10-CM

## 2016-11-03 MED ORDER — VITAMIN D (ERGOCALCIFEROL) 1.25 MG (50000 UNIT) PO CAPS: 50000.0000 [IU] | ORAL_CAPSULE | ORAL | 0 refills | Status: DC

## 2016-11-03 NOTE — Progress Notes (Signed)
Denise Byrd 03-10-1971 [id number]   History:    46 y.o.  for annual gyn exam who is maintained complaint is been tiredness and fatigue and weight gain. Patient was seen in the office every 13th of this year as a result of her history of major depressive disorder see previous note for detail. Her current medications are as follows:  Sig: Take 0.5 tablets (12.5 mcg total) by mouth daily.  Marland Kitchen temAZEpam (RESTORIL) 30 mg capsule 30 capsule 1  Sig: Take 1 at night as needed for sleep  . ziprasidone (GEODON) 60 MG capsule 30 capsule 1  Sig: Take 1 cap at night  . venlafaxine (EFFEXOR-XR) 75 MG 24 hr capsule 90 capsule 1  Sig: Take 3 a day (dose increased)  . buPROPion (WELLBUTRIN XL) 150 MG 24 hr tablet 60 tablet 1  Sig: Take 1 tab in the moning for a week, then 2 in the morning   Patient is no longer on thyroid medication and on that last office visit she had a CBC, comprehensive metabolic panel, TSH, and urinalysis which were all normal. Her lipid profile indicated her triglycerides were slightly elevated at 179 and her VLDL at 36 and she's currently seeing a nutritionist and is working on exercise.   Her lab work that indicated she has evidence of vitamin D deficiency whereby her vitamin D level was 25 and she is here to discuss those results as well. Patient with no past history of any abnormal Pap smears.  Past medical history,surgical history, family history and social history were all reviewed and documented in the EPIC chart.  Gynecologic History Patient's last menstrual period was 10/21/2016. Contraception: tubal ligation Last Pap: 2015. Results were: normal Last mammogram: 2014. Results were: normal  Obstetric History OB History  Gravida Para Term Preterm AB Living  3 3 3     3   SAB TAB Ectopic Multiple Live Births          3    # Outcome Date GA Lbr Len/2nd Weight Sex Delivery Anes PTL Lv  3 Term     M Vag-Spont  N LIV  2 Term     M Vag-Spont  N LIV  1 Term     F  Vag-Spont  N LIV       ROS: A ROS was performed and pertinent positives and negatives are included in the history.  GENERAL: No fevers or chills. HEENT: No change in vision, no earache, sore throat or sinus congestion. NECK: No pain or stiffness. CARDIOVASCULAR: No chest pain or pressure. No palpitations. PULMONARY: No shortness of breath, cough or wheeze. GASTROINTESTINAL: No abdominal pain, nausea, vomiting or diarrhea, melena or bright red blood per rectum. GENITOURINARY: No urinary frequency, urgency, hesitancy or dysuria. MUSCULOSKELETAL: No joint or muscle pain, no back pain, no recent trauma. DERMATOLOGIC: No rash, no itching, no lesions. ENDOCRINE: No polyuria, polydipsia, no heat or cold intolerance. No recent change in weight. HEMATOLOGICAL: No anemia or easy bruising or bleeding. NEUROLOGIC: No headache, seizures, numbness, tingling or weakness. PSYCHIATRIC: No depression, no loss of interest in normal activity or change in sleep pattern.     Exam: chaperone present  BP 120/80   Ht 5\' 4"  (1.626 m)   Wt 195 lb (88.5 kg)   LMP 10/21/2016   BMI 33.47 kg/m   Body mass index is 33.47 kg/m.  General appearance : Well developed well nourished female. No acute distress HEENT: Eyes: no retinal hemorrhage or exudates,  Neck supple,  trachea midline, no carotid bruits, no thyroidmegaly Lungs: Clear to auscultation, no rhonchi or wheezes, or rib retractions  Heart: Regular rate and rhythm, no murmurs or gallops Breast:Examined in sitting and supine position were symmetrical in appearance, no palpable masses or tenderness,  no skin retraction, no nipple inversion, no nipple discharge, no skin discoloration, no axillary or supraclavicular lymphadenopathy Abdomen: no palpable masses or tenderness, no rebound or guarding Extremities: no edema or skin discoloration or tenderness  Pelvic:  Bartholin, Urethra, Skene Glands: Within normal limits             Vagina: No gross lesions or  discharge  Cervix: No gross lesions or discharge  Uterus  anteverted, normal size, shape and consistency, non-tender and mobile  Adnexa  Without masses or tenderness  Anus and perineum  normal   Rectovaginal  normal sphincter tone without palpated masses or tenderness             Hemoccult not indicated     Assessment/Plan:  46 y.o. female for annual exam with laboratory confirmation of vitamin D deficiency. She'll be started on vitamin D 50,000 units every weekly for 12 weeks then return to the office for a vitamin D level checked. I have explained to her that once a returns back to normal she needs to begin taking vitamin D3 cholecalciferol 2000 units daily thereafter. She will also return in consultation to see if she continues to gain weight she will be started back again on phentermine 37.5 mg daily for 3 months which she took several years ago and helped her lose weight. Meanwhile she'll continue to follow with a nutritionist as well as regular exercise. She was given a requisition to schedule her overdue mammogram as well. And her Pap smear was done today.   Ok EdwardsFERNANDEZ,Denise Byrd, 10:57 AM 11/03/2016

## 2016-11-03 NOTE — Patient Instructions (Signed)
Deficiencia de vitaminaD (Vitamin D Deficiency) La deficiencia de vitaminaD ocurre cuando el organismo no tiene suficiente vitaminaD. La vitaminaD es importante por lo siguiente:  Ayuda al organismo a usar otros minerales que necesita.  Ayuda a Big Lotsmantener los huesos fuertes y sanos.  Puede ayudar a Conservation officer, historic buildingsevitar algunas enfermedades.  Ayuda al buen funcionamiento del corazn y de otros msculos. Para obtener vitaminaD, puede hacer lo siguiente:  Consuma alimentos que contengan vitaminaD.  Consuma o beba leche u otros alimentos con agregado de vitaminaD.  Tome un suplemento de vitaminaD.  Expngase al sol. La ingesta insuficiente de vitaminaD puede reblandecer los huesos. Tambin puede causar otros problemas de Paincourtvillesalud. CUIDADOS EN EL HOGAR  Baxter Internationalome los medicamentos y los suplementos solamente como se lo haya indicado el mdico.  Consuma alimentos que contengan vitaminaD, entre ellos:  Productos lcteos, cereales o jugos con agregado de vitaminaD. Revise las etiquetas de los productos para saber si contienen vitaminaD.  Pescados grasos, como el salmn o la [place].  Huevos.  Ostras.  No use camas solares.  Mantenga un peso saludable. Baje de peso, si es necesario.  Concurra a todas las visitas de control como se lo haya indicado el mdico. Esto es importante. SOLICITE AYUDA SI:  Los sntomas no desaparecen.  Siente malestar estomacal (nuseas).  Vomita.  Defeca menos que lo habitual o tiene dificultades para defecar (estreimiento). Esta informacin no tiene Theme park managercomo fin reemplazar el consejo del mdico. Asegrese de hacerle al mdico cualquier pregunta que tenga. Document Released: 08/19/2011 Document Revised: 05/21/2015 Document Reviewed: 01/15/2015 Elsevier Interactive Patient Education  2017 [employer]

## 2016-11-03 NOTE — Addendum Note (Signed)
Addended by: Kem ParkinsonBARNES, Zenobia Kuennen on: 11/03/2016 11:07 AM   Modules accepted: Orders

## 2016-11-04 LAB — PAP, TP IMAGING W/ HPV RNA, RFLX HPV TYPE 16,18/45: HPV mRNA, High Risk: NOT DETECTED

## 2016-11-15 ENCOUNTER — Ambulatory Visit
Admission: RE | Admit: 2016-11-15 | Discharge: 2016-11-15 | Disposition: A | Discharge status: Home / Self Care | Payer: BC Managed Care – PPO | Source: Ambulatory Visit | Attending: Gynecology | Admitting: Gynecology

## 2016-11-15 DIAGNOSIS — Z1231 Encounter for screening mammogram for malignant neoplasm of breast: Secondary | ICD-10-CM

## 2016-11-16 ENCOUNTER — Other Ambulatory Visit: Payer: Self-pay | Admitting: Gynecology

## 2017-01-26 ENCOUNTER — Encounter: Payer: Self-pay | Admitting: Gynecology

## 2017-02-09 ENCOUNTER — Ambulatory Visit: Payer: BC Managed Care – PPO | Admitting: Gynecology

## 2017-02-16 ENCOUNTER — Other Ambulatory Visit: Payer: Self-pay

## 2017-02-21 ENCOUNTER — Encounter: Payer: Self-pay | Admitting: *Deleted

## 2017-02-22 ENCOUNTER — Encounter: Payer: Self-pay | Admitting: Diagnostic Neuroimaging

## 2017-02-22 ENCOUNTER — Ambulatory Visit (INDEPENDENT_AMBULATORY_CARE_PROVIDER_SITE_OTHER): Payer: BC Managed Care – PPO | Admitting: Diagnostic Neuroimaging

## 2017-02-22 VITALS — BP 120/72 | HR 72 | Ht 65.0 in | Wt 200.4 lb

## 2017-02-22 DIAGNOSIS — G8929 Other chronic pain: Secondary | ICD-10-CM | POA: Diagnosis not present

## 2017-02-22 DIAGNOSIS — M542 Cervicalgia: Secondary | ICD-10-CM | POA: Diagnosis not present

## 2017-02-22 DIAGNOSIS — M5441 Lumbago with sciatica, right side: Secondary | ICD-10-CM | POA: Diagnosis not present

## 2017-02-22 NOTE — Patient Instructions (Signed)
Thank you for coming to see [name] at [facility]. I hope we have been able to provide you high quality care today.  You may receive a patient satisfaction survey over the next few weeks. We would appreciate your feedback and comments so that we may continue to improve ourselves and the health of our patients.  - will setup conservative treatment for next 6 weeks with physical therapy   - use ibuprofen or tylenol as needed for pain  - if no improvement after 6 weeks, then will check MRI lumbar spine  - continue psychiatry/psychology treatment of depression and anxiety  - consider water therapy, yoga exercises, gentle stretching and walking    ~~~~~~~~~~~~~~~~~~~~~~~~~~~~~~~~~~~~~~~~~~~~~~~~~~~~~~~~~~~~~~~~~  DR. PENUMALLI'S GUIDE TO HAPPY AND HEALTHY LIVING These are some of my general health and wellness recommendations. Some of them may apply to you better than others. Please use common sense as you try these suggestions and feel free to ask me any questions.   ACTIVITY/FITNESS Mental, social, emotional and physical stimulation are very important for brain and body health. Try learning a new activity (arts, music, language, sports, games).  Keep moving your body to the best of your abilities. You can do this at home, inside or outside, the park, community center, gym or anywhere you like. Consider a physical therapist or personal trainer to get started. Consider the app Sworkit. Fitness trackers such as smart-watches, smart-phones or Fitbits can help as well.   NUTRITION Eat more plants: colorful vegetables, nuts, seeds and berries.  Eat less sugar, salt, preservatives and processed foods.  Avoid toxins such as cigarettes and alcohol.  Drink water when you are thirsty. Warm water with a slice of lemon is an excellent morning drink to start the day.  Consider these websites for more information The Nutrition Source  (https://www.henry-hernandez.biz/) Precision Nutrition (WindowBlog.ch)   RELAXATION Consider practicing mindfulness meditation or other relaxation techniques such as deep breathing, prayer, yoga, tai chi, massage. See website mindful.org or the apps Headspace or Calm to help get started.   SLEEP Try to get at least 7-8+ hours sleep per day. Regular exercise and reduced caffeine will help you sleep better. Practice good sleep hygeine techniques. See website sleep.org for more information.   PLANNING Prepare estate planning, living will, healthcare POA documents. Sometimes this is best planned with the help of an attorney. Theconversationproject.org and agingwithdignity.org are excellent resources.

## 2017-02-22 NOTE — Progress Notes (Signed)
[facility]  PATIENT: Denise Byrd DOB: 1971/02/08  REFERRING CLINICIAN: D Smothers HISTORY FROM: patient  REASON FOR VISIT: new consult    HISTORICAL  CHIEF COMPLAINT:  Chief Complaint  Patient presents with  . Radiculopathy, cervical region    rm 6, New Pt , "I suffer from a lot of pain on my neck and whole back to the point that it will also go down my right leg. When I get depressed a lot I start to shake, my eye twitches and my pain starts.""    HISTORY OF PRESENT ILLNESS:   46 year old right-handed female here for evaluation of neck pain, low back pain, leg pain. In 2015 patient had mild onset of neck and low back pain. In summer 2017 this worsened. Now she is having increasing intermittent neck pain radiating down her spine, into her low back, sometimes radiating to the right leg sometimes left leg. Patient tried physical therapy in November 2017 without relief. She tried improving her nutrition, stress management, tried massage therapy, tried muscle relaxers without relief.  No specific triggering migraine factors. Patient is having weight gain as result of inactivity. She is having increasing depression anxiety due to pain. Also having problems with sleep and insomnia.   REVIEW OF SYSTEMS: Full 14 system review of systems performed and negative with exception of: Memory loss confusion constipation joint pain cramps shortness of breath hearing loss blurred vision depression anxiety hallucinations.   ALLERGIES: No Known Allergies  HOME MEDICATIONS: Outpatient Medications Prior to Visit  Medication Sig Dispense Refill  . buPROPion (WELLBUTRIN XL) 150 MG 24 hr tablet Take 1 tablet by mouth daily.    . Vitamin D, Ergocalciferol, (DRISDOL) 50000 units CAPS capsule Take 1 capsule (50,000 Units total) by mouth every 7 (seven) days. 30 capsule 0  . liothyronine (CYTOMEL) 25 MCG tablet Take 1 tablet by mouth daily.     No facility-administered  medications prior to visit.     PAST MEDICAL HISTORY: Past Medical History:  Diagnosis Date  . Anxiety   . Depression   . Vitamin D deficiency     PAST SURGICAL HISTORY: Past Surgical History:  Procedure Laterality Date  . APPENDECTOMY    . AUGMENTATION MAMMAPLASTY  DEC/18/2012   SALINE  . COMBINED AUGMENTATION MAMMAPLASTY AND ABDOMINOPLASTY  DEC/18/2012  . LIPOSUCTION TRUNK  DEC/18/2012  . TUBAL LIGATION      FAMILY HISTORY: Family History  Problem Relation Age of Onset  . Diabetes Maternal Grandmother   . Depression Mother   . Cancer Father   . Depression Maternal Aunt     SOCIAL HISTORY:  Social History   Social History  . Marital status: Divorced    Spouse name: N/A  . Number of children: 3  . Years of education: 14   Occupational History  .      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 Smoking status: Never Smoker  . Smokeless tobacco: Never Used  . Alcohol use No     Comment: OCC  . Drug use: No  . Sexual activity: Not Currently    Birth control/ protection: Surgical     Comment: tubal ligation   Other Topics Concern  . Not on file   Social History Narrative   Lives with daughter   Caffeine- coffee, 2 cups daily     PHYSICAL EXAM  GENERAL EXAM/CONSTITUTIONAL: Vitals:  Vitals:   02/22/17 1052  BP: 120/72  Pulse: 72  Weight: 200  lb 6.4 oz (90.9 kg)  Height: 5\' 5"  (1.651 m)     Body mass index is 33.35 kg/m.  Visual Acuity Screening   Right eye Left eye Both eyes  Without correction: 20/20 20/30   With correction:        Patient is in no distress; well developed, nourished and groomed; neck is supple  CARDIOVASCULAR:  Examination of carotid arteries is normal; no carotid bruits  Regular rate and rhythm, no murmurs  Examination of peripheral vascular system by observation and palpation is normal  EYES:  Ophthalmoscopic exam of optic discs and posterior segments is normal; no papilledema or  hemorrhages  MUSCULOSKELETAL:  Gait, strength, tone, movements noted in Neurologic exam below  NEUROLOGIC: MENTAL STATUS:  No flowsheet data found.  awake, alert, oriented to person, place and time  recent and remote memory intact  normal attention and concentration  language fluent, comprehension intact, naming intact,   fund of knowledge appropriate  CRANIAL NERVE:   2nd - no papilledema on fundoscopic exam  2nd, 3rd, 4th, 6th - pupils equal and reactive to light, visual fields full to confrontation, extraocular muscles intact, no nystagmus  5th - facial sensation symmetric  7th - facial strength symmetric  8th - hearing intact  9th - palate elevates symmetrically, uvula midline  11th - shoulder shrug symmetric  12th - tongue protrusion midline  MOTOR:   normal bulk and tone, full strength in the BUE, BLE  EXCEPT SLIGHT WEAKNESS IN RIGHT FOOT DORSIFLEXION  SENSORY:   normal and symmetric to light touch, pinprick, temperature, vibration  EXCEPT DECR IN RIGHT FOOT TO ALL MODALITIES  COORDINATION:   finger-nose-finger, fine finger movements normal  REFLEXES:   deep tendon reflexes present and symmetric  EXCEPT TRACE AT ANKLES  GAIT/STATION:   narrow based gait; able to walk tandem; romberg is negative    DIAGNOSTIC DATA (LABS, IMAGING, TESTING) - I reviewed patient records, labs, notes, testing and imaging myself where available.  Lab Results  Component Value Date   WBC 5.5 10/27/2016   HGB 13.9 10/27/2016   HCT 42.3 10/27/2016   MCV 93.8 10/27/2016   PLT 249 10/27/2016      Component Value Date/Time   NA 140 10/27/2016 1011   K 4.1 10/27/2016 1011   CL 106 10/27/2016 1011   CO2 23 10/27/2016 1011   GLUCOSE 85 10/27/2016 1011   BUN 14 10/27/2016 1011   CREATININE 0.83 10/27/2016 1011   CALCIUM 8.8 10/27/2016 1011   PROT 6.9 10/27/2016 1011   ALBUMIN 4.0 10/27/2016 1011   AST 15 10/27/2016 1011   ALT 10 10/27/2016 1011   ALKPHOS  65 10/27/2016 1011   BILITOT 0.6 10/27/2016 1011   Lab Results  Component Value Date   CHOL 163 10/27/2016   HDL 44 (L) 10/27/2016   LDLCALC 83 10/27/2016   TRIG 179 (H) 10/27/2016   CHOLHDL 3.7 10/27/2016   Lab Results  Component Value Date   HGBA1C 5.4 12/06/2012   No results found for: VITAMINB12 Lab Results  Component Value Date   TSH 2.32 10/27/2016    04/30/16 MRI cervical [I reviewed images myself and agree with interpretation. -VRP]  1. At C5-6 there is a mild broad-based disc bulge. Right uncovertebral degenerative changes resulting in moderate right foraminal narrowing. Mild left foraminal narrowing. 2. At C4-5 there is a mild broad-based disc bulge eccentric towards the left. Left uncovertebral degenerative changes. Mild left foraminal stenosis.     ASSESSMENT AND PLAN  46 y.o. year old female here with Neck and low back pain since 2015, worsening since summer 2017. Patient has some right leg weakness limited by pain, decreased sensation in the right foot, concerning for possible right lumbar radiculopathy. Advised patient to try conservative management for next 6 weeks with physical therapy. If no improvement then we'll check MRI of the lumbar spine.   Ddx: right lumbar radiculopathy + neck pain  1. Neck pain   2. Chronic right-sided low back pain with right-sided sciatica      PLAN: - will setup conservative mgmt for next 6 weeks with PT evaluation - use ibuprofen or tylenol as needed for pain - if no improvement after 6 weeks, then will check MRI lumbar spine - continue psychiatry/psychology treatment of depression and anxiety - consider water therapy, yoga exercises, gentle stretching and walking  Orders Placed This Encounter  Procedures  . Ambulatory referral to Physical Therapy   Return in about 3 months (around 05/25/2017).    Suanne MarkerVIKRAM R. Cartier Mapel, MD 02/22/2017, 11:33 AM Certified in Neurology, Neurophysiology and Neuroimaging  [facility] [address], [address] [place], [place] [address] [phone]

## 2017-02-23 ENCOUNTER — Telehealth: Payer: Self-pay | Admitting: *Deleted

## 2017-02-23 ENCOUNTER — Ambulatory Visit (INDEPENDENT_AMBULATORY_CARE_PROVIDER_SITE_OTHER): Payer: BC Managed Care – PPO | Admitting: Gynecology

## 2017-02-23 ENCOUNTER — Encounter: Payer: Self-pay | Admitting: Gynecology

## 2017-02-23 VITALS — BP 108/78 | Ht 65.0 in | Wt 198.0 lb

## 2017-02-23 DIAGNOSIS — R635 Abnormal weight gain: Secondary | ICD-10-CM | POA: Diagnosis not present

## 2017-02-23 DIAGNOSIS — E559 Vitamin D deficiency, unspecified: Secondary | ICD-10-CM | POA: Diagnosis not present

## 2017-02-23 DIAGNOSIS — E663 Overweight: Secondary | ICD-10-CM

## 2017-02-23 MED ORDER — PHENTERMINE HCL 37.5 MG PO CAPS: 37.5000 mg | ORAL_CAPSULE | ORAL | 3 refills | Status: DC

## 2017-02-23 NOTE — Patient Instructions (Addendum)
Phentermine tablets or capsules Qu es este medicamento? La FENTERMINA reduce su apetito. Si la combina con una dieta de bajas caloras y ejercio puede ayudarle a bajar de peso. Este medicamento puede ser utilizado para otros usos; si tiene alguna pregunta consulte con su proveedor de atencin mdica o con su farmacutico. MARCAS COMUNES: Adipex-P, Atti-Plex P, Atti-Plex P Spansule, Fastin, Lomaira, Pro-Fast, Tara-8 Qu le debo informar a mi profesional de la salud antes de tomar este medicamento? Necesita saber si usted presenta alguno de los siguientes problemas o situaciones: -agitacin -glaucoma -enfermedad cardiaca -alta presin sangunea -antecedentes de abuso de substancias -enfermedad pulmonar llamada Hipertensin Pulmonar Primaria -si ha tomado un IMAO, tales como Carbex, Eldepryl, Marplan, Nardil o Parnate en los ltimos 14 das -enfermedad tiroidea -una reaccin alrgica o inusual a la fentermina, a otros medicamentos, alimentos, colorantes o conservantes -si est embarazada o buscando quedar embarazada -si est amamantando a un beb Cmo debo utilizar este medicamento? Tome este medicamento por va oral con un vaso de agua. Siga las instrucciones de la etiqueta del medicamento. Las instrucciones de uso pueden variar segn el producto y la dosis que est tomando. Evite tomar este medicamento por la tarde antes de acostarse a dormir. Puede interferir con la conciliacin del sueo. Tome sus dosis a intervalos regulares. No tome su medicamento con una frecuencia mayor a la indicada. Hable con su pediatra para informarse acerca del uso de este medicamento en nios. Aunque este medicamento se puede recetar a nios a partir de los 17 aos de edad en casos selectos, existen precauciones que deben cumplirse. Sobredosis: Pngase en contacto inmediatamente con un centro toxicolgico o una sala de urgencia si usted cree que haya tomado demasiado medicamento. ATENCIN: Este medicamento es  solo para usted. No comparta este medicamento con nadie. Qu sucede si me olvido de una dosis? Si olvida una dosis, tmela lo antes posible. Si es casi la hora de la prxima dosis, tome slo esa dosis. No tome dosis adicionales o dobles. Qu puede interactuar con este medicamento? No tome esta medicina con ninguno de los siguientes medicamentos: -duloxetina -IMAOs, tales como Carbex, Eldepryl, Marplan, Nardil y Parnate -medicamentos para resfros o problemas respiratorios, tales como seudoefedrina o fenilefrina -procarbazina -sibutramine -ISRS, tales como citalopram, escitalopram, fluoxetina, fluvoxamina, paroxetina y sertralina -estimulantes, tales como dexmetilfenidato, metilfenidato o modafinil -venlafaxina Esta medicina tambin puede interactuar con los siguientes medicamentos: -medicamentos para la diabetes Puede ser que esta lista no menciona todas las posibles interacciones. Informe a su profesional de la salud de todos los productos a base de hierbas, medicamentos de venta libre o suplementos nutritivos que est tomando. Si usted fuma, consume bebidas alcohlicas o si utiliza drogas ilegales, indqueselo tambin a su profesional de la salud. Algunas sustancias pueden interactuar con su medicamento. A qu debo estar atento al usar este medicamento? Informe a su mdico de inmediato si siente que le falta el aliento mientras realiza sus actividades habituales. No tome este medicamento dentro de 6 horas de la hora de acostarse. Puede impedirle dormir. Evite las bebidas que contienen cafena y trata de mantener un horario de acostarse habitual cada noche. Este medicamento fue diseado para ser utilizado en combinacin con una dieta saludable y ejercicio. De esta manera se obtienen los mejores resultados. Este medicamento slo est indicado para uso a corto plazo. Con el tiempo, su peso puede estabilizarse. A partir de ese momento, el medicamento slo le ayudar a mantener su nuevo peso. No  aumente ni modifique su dosis de ninguna manera sin   consultar a su mdico. Puede experimentar mareos o somnolencia. No conduzca ni utilice maquinaria ni haga nada que Scientist, research (life sciences)le exija permanecer en estado de alerta hasta que sepa cmo le afecta este medicamento. No se siente ni se ponga de pie con rapidez, especialmente si es un paciente de edad avanzada. Esto reduce el riesgo de mareos o Newell Rubbermaiddesmayos. El alcohol puede aumentar los mareos y la somnolencia. Evite consumir bebidas alcohlicas. Qu efectos secundarios puedo tener al Boston Scientificutilizar este medicamento? Efectos secundarios que debe informar a su mdico o a Producer, television/film/videosu profesional de la salud tan pronto como sea posible: -Journalist, newspaperdolor en el pecho, palpitaciones -depresin o cambios severos de estados de nimo -aumento de la presin sangunea -irritabilidad -nerviosismo o inquietud -mareos severos -falta de aliento -problemas para orinar -hinchazn inusual de las piernas -vmito Efectos secundarios que, por lo general, no requieren Psychologist, prison and probation servicesatencin mdica (debe informarlos a su mdico o a su profesional de la salud si persisten o si son molestos): -visin borrosa u otros problemas de ojo -cambios en la capacidad o el deseo sexual -estreimiento o diarrea -dificultad para conciliar el sueo -boca seca o sabor desagradable -dolor de cabeza -nuseas Puede ser que esta lista no menciona todos los posibles efectos secundarios. Comunquese a su mdico por asesoramiento mdico Hewlett-Packardsobre los efectos secundarios. Usted puede informar los efectos secundarios a la FDA por telfono al 1-800-FDA-1088. Dnde debo guardar mi medicina? Mantenga fuera del alcance de los nios. Existe la posibilidad de abusar de PPL Corporationeste medicamento. Mantenga su medicamento en un lugar seguro para protegerlo contra robos. No comparta este medicamento con nadie. Es peligroso vender o Restaurant manager, fast foodregalar este medicamento, y est prohibido por la ley. Este medicamento puede causar Neomia Dearuna sobredosis accidental y la muerte si lo toman  otros adultos, nios o Neurosurgeonmascotas. Mezcle todo el medicamento sin usar con una sustancia como piedras sanitarias para gatos o posos (residuos) de caf. Luego deseche el Science Applications Internationalmedicamento en un recipiente cerrado, como una bolsa sellada o una lata de caf con tapa. No utilice este medicamento despus de la fecha de vencimiento. Guarde a Sanmina-SCItemperatura ambiente, entre 20 y 25 grados Celsius (68 y 1577 grados Fahrenheit). Mantenga el recipiente bien cerrado. ATENCIN: Este folleto es un resumen. Puede ser que no cubra toda la posible informacin. Si usted tiene preguntas acerca de esta medicina, consulte con su mdico, su farmacutico o su profesional de Radiographer, therapeuticla salud.  2018 Elsevier/Gold Standard (2016-09-30 00:00:00) Deficiencia de vitaminaD (Vitamin D Deficiency) La deficiencia de vitaminaD ocurre cuando el organismo no tiene suficiente vitaminaD. La vitaminaD es importante para el organismo por muchos motivos:  Ayuda al organismo a Insurance underwriterabsorber dos minerales llamados calcio y fsforo.  Desempea un papel en la salud de los Wingatehuesos.  Puede ayudar a prevenir Dollar Generalalgunas enfermedades, como la diabetes y Chartered loss adjusterla esclerosis mltiple.  Desempea un papel en la funcin muscular, incluida la actividad cardaca. Para obtener vitaminaD, puede hacer lo siguiente:  Consuma alimentos que contengan naturalmente vitaminaD.  Consuma o beba leche u otros productos lcteos con agregado de vitaminaD.  Tome un suplemento de vitaminaD o un suplemento multivitamnico que la contenga.  Expngase al sol. El organismo produce vitaminaD de forma natural cuando se expone la piel a la luz del sol. El organismo transforma la luz del sol en una forma de vitamina que puede [place]utilizar. Si la deficiencia de vitaminaD es grave, puede causar una enfermedad que Sanmina-SCIreblandece los huesos. En los adultos, se la conoce como osteomalacia. En los nios, recibe el nombre de raquitismo. CAUSAS La deficiencia de vitaminaD puede  deberse a lo siguiente:  No  comer suficientes alimentos que contengan vitamina D.  Exposicin insuficiente al sol.  Sufrir Theatre manager del sistema digestivo que le dificultan al organismo la absorcin de vitaminaD. Estas enfermedades incluyen la enfermedad de Crohn, la pancreatitis crnica y la fibrosis [place].  Someterse a una Edison International se extirpa Neomia Dear parte del estmago o del intestino delgado.  Ser obeso.  Tener enfermedad renal crnica o enfermedad heptica. FACTORES DE RIESGO Es ms probable que esta afeccin se manifieste en:  Las personas de edad [place].  Las Eli Lilly and Company no pasan mucho tiempo al OGE Energy.  Las personas que viven en un centro de atencin de Glass blower/designer.  Las personas que sufrieron fracturas seas.  Las personas que tienen huesos dbiles o delgados (osteoporosis).  Las personas que sufren una enfermedad o un trastorno que modifica la forma en que el organismo absorbe la vitaminaD.  Las personas de Albertson's.  Las personas que toman algunos medicamentos, como corticoides o determinados anticonvulsivos.  Las personas con sobrepeso u obesidad. SNTOMAS En los casos leves de deficiencia de vitaminaD, puede no haber sntomas. Si el trastorno es grave, los sntomas pueden incluir lo siguiente:  Kerr-McGee.  Dolor muscular.  Caerse con frecuencia.  Huesos fracturados por Futures trader. DIAGNSTICO Por lo general, este trastorno se diagnostica mediante un anlisis de [place]. TRATAMIENTO El tratamiento de este trastorno puede depender de la causa. Entre las otras opciones de [place], se incluyen las siguientes:  Tomar suplementos de vitamina D.  Tomar un suplemento de calcio. El Social worker cul es la dosis ms [place] para usted. INSTRUCCIONES PARA EL CUIDADO EN EL HOGAR  Tome los medicamentos y los suplementos solamente como se lo haya indicado el mdico.  Consuma alimentos que contengan vitaminaD, como por  ejemplo: ? Productos lcteos, cereales o jugos fortificados. El trmino fortificado significa que se ha aadido vitaminaD al [employer]ors [employer]. Revise la etiqueta del paquete para estar seguro. ? Pescados grasos, como el salmn o la [place]. ? Huevos. ? Ostras.  No utilice camas solares.  Mantenga un peso saludable. Baje de peso, si es necesario.  Concurra a todas las visitas de control como se lo haya indicado el mdico. Esto es importante. SOLICITE ATENCIN MDICA SI:  Los sntomas no desaparecen.  Tiene malestar estomacal (nuseas) o vomita.  Defeca menos que lo habitual o le resulta difcil defecar (estreimiento). Esta informacin no tiene Theme park manager el consejo del mdico. Asegrese de hacerle al mdico cualquier pregunta que tenga. Document Released: 11/22/2011 Document Revised: 05/21/2015 Document Reviewed: 01/15/2015 Elsevier Interactive Patient Education  2018 ArvinMeritor.

## 2017-02-23 NOTE — Telephone Encounter (Signed)
-----   Message from Ok EdwardsJuan H Fernandez, MD sent at 02/23/2017 12:09 PM EDT ----- Victorino DikeJennifer, please make an appointment for this patient to see the nutritionist to work on a diet plan for her because of her weight gain. She is going out of town for 6 weeks this Friday so we'll have to be [place] Thank you

## 2017-02-23 NOTE — Telephone Encounter (Signed)
[facility]erral placed at [facility] they will contact pt to schedule.

## 2017-02-23 NOTE — Progress Notes (Signed)
   Patient's a [age] year old that presented to the office today to discuss her weight gain issues. She was seen in the office for annual exam in February 2018. At that time she had a normal TSH and blood sugar. She is trying to lose weight but has been difficult for her. Several years ago she successfully lost weight when she was placed on phentermine 37.5 mg daily for a three-month course. She would like to have a prescription refill again for this medication. Patient with no history of hypertension reported she is taking the following medications:  Sig: Take 0.5 tablets (12.5 mcg total) by mouth daily.  Marland Kitchen.temAZEpam (RESTORIL) 30 mg capsule 30 capsule 1  Sig: Take 1 at night as needed for sleep  .ziprasidone (GEODON) 60 MG capsule 30 capsule 1  Sig: Take 1 cap at night  .venlafaxine (EFFEXOR-XR) 75 MG 24 hr capsule 90 capsule 1  Sig: Take 3 a day (dose increased)  .buPROPion (WELLBUTRIN XL) 150 MG 24 hr tablet 60 tablet 1  Sig: Take 1 tab in the moning for a week, then 2 in the morning   Blood pressure today 108/78   weight 198 pounds  BMI 32.95 kg/m Gen. appearance well-developed well-nourished female in no acute distress HEENT unremarkable Neck supple trachea midline no carotid bruits or thyromegaly Lungs: Clear to auscultation rhonchus or wheezes Heart: Regular rate and rhythm no murmurs or gallops  We discussed importance of regular exercise as well as a structured diet plan. For this reason going to refer her to the nutritionist for additional support. She will be started on phentermine 37.5 mg daily as an appetite suppressant. The risks benefits and pros and cons of the medication were discussed with the patient's follows: Pulmonary hypertension Heart valvular disorder Palpitations Myalgias Headache Dry mouth  Patient fully understands and accepts these risks. She will be monitored monthly for blood pressure as well as pulmonary and cardiac auscultation over the three-month  course. Patient with previous tubal ligation.  Review of patient's record from last office visit had indicated she had vitamin D deficiency and was started on 50,000 units every weekly for three-month course which she took but did not return to check her vitamin D level and currently not taking any vitamin D. We'll check her vitamin D level today.  Greater than 90% time was spent counseling and coordinating care for this patient who is overweight total time of consultation 15 minutes

## 2017-02-24 ENCOUNTER — Telehealth: Payer: Self-pay | Admitting: *Deleted

## 2017-02-24 DIAGNOSIS — E559 Vitamin D deficiency, unspecified: Secondary | ICD-10-CM

## 2017-02-24 LAB — VITAMIN D 25 HYDROXY (VIT D DEFICIENCY, FRACTURES): Vit D, 25-Hydroxy: 27 ng/mL — ABNORMAL LOW (ref 30–100)

## 2017-02-24 MED ORDER — VITAMIN D (ERGOCALCIFEROL) 1.25 MG (50000 UNIT) PO CAPS: ORAL_CAPSULE | ORAL | 0 refills | Status: DC

## 2017-02-24 MED ORDER — PHENTERMINE HCL 37.5 MG PO CAPS: 37.5000 mg | ORAL_CAPSULE | ORAL | 3 refills | Status: DC

## 2017-02-24 NOTE — Telephone Encounter (Signed)
Pt Rx for phentermine 37.5 mg capsule was printed yesterday, Rx will be called in today, and Vitamin D. 50,000 units from result note will be sent as well,order placed for recheck vitamin d.

## 2017-02-25 ENCOUNTER — Telehealth: Payer: Self-pay

## 2017-02-25 NOTE — Telephone Encounter (Signed)
Patient called in voice mail about medication being called in somewhere else because of having to be ordered. I called her back to discuss and get details but had to leave a message in voice mail for her to return my call.

## 2017-02-28 ENCOUNTER — Telehealth: Payer: Self-pay

## 2017-02-28 ENCOUNTER — Other Ambulatory Visit: Payer: Self-pay | Admitting: Gynecology

## 2017-02-28 NOTE — Telephone Encounter (Signed)
Patient said she took her [facility] to [facility] to [facility]. They could not fill it that day and she was to go back to pick it up another day, Meantime she had to leave town and is in [place] for a family matter. She does note know when she will return and asked if Vit D and Phentermine could be transferred to [facility] [place]. [facility] and she will take care of transferring them. [place] pharmacy info was provided to her. Patient informed.

## 2017-03-21 ENCOUNTER — Encounter: Payer: Self-pay | Admitting: *Deleted

## 2017-03-21 ENCOUNTER — Telehealth: Payer: Self-pay | Admitting: *Deleted

## 2017-03-21 NOTE — Telephone Encounter (Signed)
Received fax from [facility] that they have not been able to reach pt.  I called her mobile/home and could not LM, (different lang) and then work # she was not there.  Will send letter.

## 2017-03-29 NOTE — Telephone Encounter (Signed)
nutrition office has left message for pt to call. X 2

## 2017-04-07 ENCOUNTER — Ambulatory Visit: Payer: BC Managed Care – PPO | Admitting: Gynecology

## 2017-04-27 ENCOUNTER — Encounter: Payer: Self-pay | Admitting: Registered"

## 2017-04-27 ENCOUNTER — Encounter: Payer: BC Managed Care – PPO | Attending: Diagnostic Neuroimaging | Admitting: Registered"

## 2017-04-27 DIAGNOSIS — Z713 Dietary counseling and surveillance: Secondary | ICD-10-CM | POA: Insufficient documentation

## 2017-04-27 DIAGNOSIS — E663 Overweight: Secondary | ICD-10-CM | POA: Diagnosis present

## 2017-04-27 NOTE — Progress Notes (Signed)
Medical Nutrition Therapy:  Appt start time: 0955 end time:  1045.   Assessment:  Primary concerns today: Pt referred for weight management. Pt says she would like help with losing weight. Pt says she would like to start back taking a diet pill, but she would like one without any side effects. She says she lost weight while taking phentermine in the past. She says she has struggled with weight cycling. Pt says she did not used to have problems with watching what she eats, but after going through a divorce she became depressed and since then she has struggled with wanting to eat for comfort. Pt says her depression has improved through treatment and her faith, but she still wants to eat throughout the day if she gets bored and/or stressed. Pt says she does not feel as good about herself since she has gained weight and that she does not think she feels as well since she is carrying more weight.   Preferred Learning Style:   No preference indicated   Learning Readiness:  Ready  MEDICATIONS: See list.    DIETARY INTAKE:  Usual eating pattern includes eating on and off throughout the day. Pt says she wants to eat most of the time, but she did not used to be that way before she started having trouble with depression. She says she has been eating out a lot lately, but she does enjoy cooking and plans to start cooking more once she gets settled in her home.   Everyday foods vary.  Avoided foods include none reported.    Pt says meals are eaten in the living room on her sofa, but she does not have electronics on while she eats. She says she likes to be able to enjoy her food.   24-hr recall:  B ( AM): yogurt Snk ( AM): None reported.   L ( PM): [employer] burger, a few fries, sweet tea Snk (2 PM): coffee, [employer]-grilled tilapia  D ( PM): fruit salad, iced sweet tea  Snk (11 PM): rice, chicken wings, shrimp, scallops, Diet Coke  Beverages: sweet tea, water, Diet Coke   Usual physical activity:  Pt says she just bought a membership [facility] [employer] and did 50 minutes of cardio yesterday. She says she sometimes walks around her neighborhood. She plans to start doing exercise 3 days a week and increase as her body gets more used to it.   Progress Towards Goal(s):  In progress.   Nutritional Diagnosis:  [id number] Predicted suboptimal nutrient intake As related to unbalanced diet low in vegetables and whole grains and regular intake of sugar sweetened beverages.  As evidenced by pt's reported dietary recall and habits .    Intervention:  Nutrition counseling provided. Dietitian provided education regarding balanced nutrition. Dietitian discussed with pt the importance of focusing on overall health and getting in balanced nutrition and physical activity rather than focusing on weight. Dietitian discussed the effects of restrictive dieting on the body's metabolism/how it can cause weight cycling. Dietitian discussed mindful eating and ways to help prevent snacking due to boredom/stress rather than hunger. Discussed possible snack ideas with pt. Dietitian encouraged pt to get in regular physical activity. Dietitian discussed with pt that her low vitamin D level could be contributing to her fatigue and encouraged pt to follow her physician's recommendations regarding taking her vitamin D supplement. Pt appeared agreeable to information/goals discussed.   Goals:  Make sure to get in three meals per day. Try to eat balanced meals  like the plate example (see handout). For snacks-try to include more fiber and protein to help with satiety. (See list).   Try to practice mindful eating-if you feel you are wanting to snack because you are bored or stressed but not hungry-try to do an activity for at least 20 minutes. Maybe go for a walk or read a book.   Continue working to get in regular physical activity-a great goal is getting in at least 30 minutes most days of the week or 150 minutes per week.   Try to  drink more water and less sweetened beverages. Could try [facility] or another type of carbonated water without any sweetener.   [email or web address] -healthy recipe resource   Teaching Method Utilized:  Visual Auditory  Handouts given during visit include:  Balanced Plate with food list   Healthy Snack Ideas   Low Fat Cooking Methods   Barriers to learning/adherence to lifestyle change: None indicated.   Demonstrated degree of understanding via:  Teach Back   Monitoring/Evaluation:  Dietary intake, exercise, and body weight in 1 month(s).

## 2017-04-27 NOTE — Patient Instructions (Addendum)
Make sure to get in three meals per day. Try to eat balanced meals like the plate example (see handout). For snacks-try to include more fiber and protein to help with satiety. (See list).   Try to practice mindful eating-if you feel you are wanting to snack because you are bored or stressed but not hungry-try to do an activity for at least 20 minutes. Maybe go for a walk or read a book.   Continue working to get in regular physical activity-a great goal is getting in at least 30 minutes most days of the week or 150 minutes per week.   Try to drink more water and less sweetened beverages. Could try [facility] or another type of carbonated water without any sweetener.

## 2017-05-09 ENCOUNTER — Telehealth: Payer: Self-pay | Admitting: *Deleted

## 2017-05-09 NOTE — Telephone Encounter (Signed)
Pt called c/o having issues with refilling phentermine 37.5mg  capsules, pt left [place] and had Rx [place] to another states so this cancelled Rx for [facility] per [employer]. I called in phentermine refill #30 with 1 refill for pt as she only did one month from the originally new Rx which 02/24/17. I called and left this message on pt voicemail.

## 2017-06-14 ENCOUNTER — Encounter: Payer: Self-pay | Admitting: Diagnostic Neuroimaging

## 2017-06-14 ENCOUNTER — Ambulatory Visit: Payer: BC Managed Care – PPO | Admitting: Diagnostic Neuroimaging

## 2018-01-17 ENCOUNTER — Encounter: Payer: Self-pay | Admitting: Women's Health

## 2018-01-17 ENCOUNTER — Ambulatory Visit: Payer: BC Managed Care – PPO | Admitting: Women's Health

## 2018-01-17 VITALS — BP 122/80

## 2018-01-17 DIAGNOSIS — R635 Abnormal weight gain: Secondary | ICD-10-CM

## 2018-01-17 DIAGNOSIS — R5383 Other fatigue: Secondary | ICD-10-CM

## 2018-01-17 DIAGNOSIS — R6889 Other general symptoms and signs: Secondary | ICD-10-CM

## 2018-01-17 NOTE — Progress Notes (Signed)
47 year old M HF G3, P3 presents with numerous complaints for the past 2 months. Iincreased anxiety, weight gain, fatigue, cold hands and feet, increased appetite, constipation, cycles greater than every 21 days, lasting for 3 days, and generally not feeling as well as she once did.  History of hypothyroidism, did stop medication greater than 1 year ago.  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 States numerous behavioral issues in the school system.  History of anxiety/depression currently on Effexor, Geodon and Restoril as needed for sleep per psychiatrist.  Lia Hopping blood work.  Also requested phentermine.  Exam: Appears well, able to articulate concerns readily.  Questionable hypothyroid Obesity Situational stress  Pain: TSH, T4, CBC, CMP . Denied phentermine prescription, encouraged low-carb diet, increase physical activity, self-care, leisure activities encouraged.  Schedule annual exam, will follow up with lab results.

## 2018-01-17 NOTE — Patient Instructions (Signed)

## 2018-01-18 ENCOUNTER — Encounter: Payer: Self-pay | Admitting: Women's Health

## 2018-01-18 LAB — COMPREHENSIVE METABOLIC PANEL
AG Ratio: 1.5 (calc) (ref 1.0–2.5)
ALT: 12 U/L (ref 6–29)
AST: 18 U/L (ref 10–35)
Albumin: 4.3 g/dL (ref 3.6–5.1)
Alkaline phosphatase (APISO): 66 U/L (ref 33–115)
BUN: 17 mg/dL (ref 7–25)
CO2: 25 mmol/L (ref 20–32)
Calcium: 8.9 mg/dL (ref 8.6–10.2)
Chloride: 106 mmol/L (ref 98–110)
Creat: 0.84 mg/dL (ref 0.50–1.10)
Globulin: 2.8 g/dL (calc) (ref 1.9–3.7)
Glucose, Bld: 76 mg/dL (ref 65–99)
Potassium: 3.9 mmol/L (ref 3.5–5.3)
Sodium: 138 mmol/L (ref 135–146)
Total Bilirubin: 0.6 mg/dL (ref 0.2–1.2)
Total Protein: 7.1 g/dL (ref 6.1–8.1)

## 2018-01-18 LAB — CBC WITH DIFFERENTIAL/PLATELET
Basophils Absolute: 40 cells/uL (ref 0–200)
Basophils Relative: 0.5 %
Eosinophils Absolute: 126 cells/uL (ref 15–500)
Eosinophils Relative: 1.6 %
HCT: 39.7 % (ref 35.0–45.0)
Hemoglobin: 13.5 g/dL (ref 11.7–15.5)
Lymphs Abs: 2591 cells/uL (ref 850–3900)
MCH: 30.3 pg (ref 27.0–33.0)
MCHC: 34 g/dL (ref 32.0–36.0)
MCV: 89.2 fL (ref 80.0–100.0)
MPV: 10.3 fL (ref 7.5–12.5)
Monocytes Relative: 8.1 %
Neutro Abs: 4503 cells/uL (ref 1500–7800)
Neutrophils Relative %: 57 %
Platelets: 252 10*3/uL (ref 140–400)
RBC: 4.45 10*6/uL (ref 3.80–5.10)
RDW: 12.5 % (ref 11.0–15.0)
Total Lymphocyte: 32.8 %
WBC mixed population: 640 cells/uL (ref 200–950)
WBC: 7.9 10*3/uL (ref 3.8–10.8)

## 2018-01-18 LAB — TSH: TSH: 2.92 mIU/L

## 2018-01-18 LAB — T4: T4, Total: 6.6 ug/dL (ref 5.1–11.9)

## 2018-03-06 ENCOUNTER — Encounter: Payer: BC Managed Care – PPO | Admitting: Women's Health

## 2018-03-06 DIAGNOSIS — Z0289 Encounter for other administrative examinations: Secondary | ICD-10-CM

## 2018-03-28 ENCOUNTER — Other Ambulatory Visit: Payer: Self-pay | Admitting: Women's Health

## 2018-03-28 DIAGNOSIS — Z1231 Encounter for screening mammogram for malignant neoplasm of breast: Secondary | ICD-10-CM

## 2018-04-05 ENCOUNTER — Ambulatory Visit (INDEPENDENT_AMBULATORY_CARE_PROVIDER_SITE_OTHER): Payer: BC Managed Care – PPO | Admitting: Women's Health

## 2018-04-05 ENCOUNTER — Encounter: Payer: Self-pay | Admitting: Women's Health

## 2018-04-05 VITALS — BP 118/80 | Ht 64.0 in | Wt 202.0 lb

## 2018-04-05 DIAGNOSIS — Z01419 Encounter for gynecological examination (general) (routine) without abnormal findings: Secondary | ICD-10-CM

## 2018-04-05 LAB — CBC WITH DIFFERENTIAL/PLATELET
Basophils Absolute: 19 cells/uL (ref 0–200)
Basophils Relative: 0.3 %
Eosinophils Absolute: 82 cells/uL (ref 15–500)
Eosinophils Relative: 1.3 %
HCT: 41.1 % (ref 35.0–45.0)
Hemoglobin: 13.9 g/dL (ref 11.7–15.5)
Lymphs Abs: 2111 cells/uL (ref 850–3900)
MCH: 30.5 pg (ref 27.0–33.0)
MCHC: 33.8 g/dL (ref 32.0–36.0)
MCV: 90.1 fL (ref 80.0–100.0)
MPV: 10.1 fL (ref 7.5–12.5)
Monocytes Relative: 9.3 %
Neutro Abs: 3503 cells/uL (ref 1500–7800)
Neutrophils Relative %: 55.6 %
Platelets: 254 10*3/uL (ref 140–400)
RBC: 4.56 10*6/uL (ref 3.80–5.10)
RDW: 12.5 % (ref 11.0–15.0)
Total Lymphocyte: 33.5 %
WBC mixed population: 586 cells/uL (ref 200–950)
WBC: 6.3 10*3/uL (ref 3.8–10.8)

## 2018-04-05 LAB — COMPREHENSIVE METABOLIC PANEL
AG Ratio: 1.4 (calc) (ref 1.0–2.5)
ALT: 12 U/L (ref 6–29)
AST: 17 U/L (ref 10–35)
Albumin: 4.2 g/dL (ref 3.6–5.1)
Alkaline phosphatase (APISO): 72 U/L (ref 33–115)
BUN: 19 mg/dL (ref 7–25)
CO2: 29 mmol/L (ref 20–32)
Calcium: 9.8 mg/dL (ref 8.6–10.2)
Chloride: 103 mmol/L (ref 98–110)
Creat: 0.82 mg/dL (ref 0.50–1.10)
Globulin: 2.9 g/dL (calc) (ref 1.9–3.7)
Glucose, Bld: 81 mg/dL (ref 65–99)
Potassium: 4.3 mmol/L (ref 3.5–5.3)
Sodium: 137 mmol/L (ref 135–146)
Total Bilirubin: 0.3 mg/dL (ref 0.2–1.2)
Total Protein: 7.1 g/dL (ref 6.1–8.1)

## 2018-04-05 NOTE — Patient Instructions (Addendum)
[facility] [phone]    Health Maintenance, Female Adopting a healthy lifestyle and getting preventive care can go a long way to promote health and wellness. Talk with your health care provider about what schedule of regular examinations is right for you. This is a good chance for you to check in with your provider about disease prevention and staying healthy. In between checkups, there are plenty of things you can do on your own. Experts have done a lot of research about which lifestyle changes and preventive measures are most likely to keep you healthy. Ask your health care provider for more information. Weight and diet Eat a healthy diet  Be sure to include plenty of vegetables, fruits, low-fat dairy products, and lean protein.  Do not eat a lot of foods high in solid fats, added sugars, or salt.  Get regular exercise. This is one of the most important things you can do for your health. ? Most adults should exercise for at least 150 minutes each week. The exercise should increase your heart rate and make you sweat (moderate-intensity exercise). ? Most adults should also do strengthening exercises at least twice a week. This is in addition to the moderate-intensity exercise.  Maintain a healthy weight  Body mass index (BMI) is a measurement that can be used to identify possible weight problems. It estimates body fat based on height and weight. Your health care provider can help determine your BMI and help you achieve or maintain a healthy weight.  For females 34 years of age and older: ? A BMI below 18.5 is considered underweight. ? A BMI of 18.5 to 24.9 is normal. ? A BMI of 25 to 29.9 is considered overweight. ? A BMI of 30 and above is considered obese.  Watch levels of cholesterol and blood lipids  You should start having your blood tested for lipids and cholesterol at 47 years of age, then have this test every 5 years.  You may need to have your cholesterol levels  checked more often if: ? Your lipid or cholesterol levels are high. ? You are older than 47 years of age. ? You are at high risk for heart disease.  Cancer screening Lung Cancer  Lung cancer screening is recommended for adults 14-61 years old who are at high risk for lung cancer because of a history of smoking.  A yearly low-dose CT scan of the lungs is recommended for people who: ? Currently smoke. ? Have quit within the past 15 years. ? Have at least a 30-pack-year history of smoking. A pack year is smoking an average of one pack of cigarettes a day for 1 year.  Yearly screening should continue until it has been 15 years since you quit.  Yearly screening should stop if you develop a health problem that would prevent you from having lung cancer treatment.  Breast Cancer  Practice breast self-awareness. This means understanding how your breasts normally appear and feel.  It also means doing regular breast self-exams. Let your health care provider know about any changes, no matter how small.  If you are in your 20s or 30s, you should have a clinical breast exam (CBE) by a health care provider every 1-3 years as part of a regular health exam.  If you are 43 or older, have a CBE every year. Also consider having a breast X-ray (mammogram) every year.  If you have a family history of breast cancer, talk to your health care provider about genetic screening.  If you are at high risk for breast cancer, talk to your health care provider about having an MRI and a mammogram every year.  Breast cancer gene (BRCA) assessment is recommended for women who have family members with BRCA-related cancers. BRCA-related cancers include: ? Breast. ? Ovarian. ? Tubal. ? Peritoneal cancers.  Results of the assessment will determine the need for genetic counseling and BRCA1 and BRCA2 testing.  Cervical Cancer Your health care provider may recommend that you be screened regularly for cancer of the  pelvic organs (ovaries, uterus, and vagina). This screening involves a pelvic examination, including checking for microscopic changes to the surface of your cervix (Pap test). You may be encouraged to have this screening done every 3 years, beginning at age 71.  For women ages 82-65, health care providers may recommend pelvic exams and Pap testing every 3 years, or they may recommend the Pap and pelvic exam, combined with testing for human papilloma virus (HPV), every 5 years. Some types of HPV increase your risk of cervical cancer. Testing for HPV may also be done on women of any age with unclear Pap test results.  Other health care providers may not recommend any screening for nonpregnant women who are considered low risk for pelvic cancer and who do not have symptoms. Ask your health care provider if a screening pelvic exam is right for you.  If you have had past treatment for cervical cancer or a condition that could lead to cancer, you need Pap tests and screening for cancer for at least 20 years after your treatment. If Pap tests have been discontinued, your risk factors (such as having a new sexual partner) need to be reassessed to determine if screening should resume. Some women have medical problems that increase the chance of getting cervical cancer. In these cases, your health care provider may recommend more frequent screening and Pap tests.  Colorectal Cancer  This type of cancer can be detected and often prevented.  Routine colorectal cancer screening usually begins at 47 years of age and continues through 47 years of age.  Your health care provider may recommend screening at an earlier age if you have risk factors for colon cancer.  Your health care provider may also recommend using home test kits to check for hidden blood in the stool.  A small camera at the end of a tube can be used to examine your colon directly (sigmoidoscopy or colonoscopy). This is done to check for the  earliest forms of colorectal cancer.  Routine screening usually begins at age 15.  Direct examination of the colon should be repeated every 5-10 years through 47 years of age. However, you may need to be screened more often if early forms of precancerous polyps or small growths are found.  Skin Cancer  Check your skin from head to toe regularly.  Tell your health care provider about any new moles or changes in moles, especially if there is a change in a mole's shape or color.  Also tell your health care provider if you have a mole that is larger than the size of a pencil eraser.  Always use sunscreen. Apply sunscreen liberally and repeatedly throughout the day.  Protect yourself by wearing long sleeves, pants, a wide-brimmed hat, and sunglasses whenever you are outside.  Heart disease, diabetes, and high blood pressure  High blood pressure causes heart disease and increases the risk of stroke. High blood pressure is more likely to develop in: ? People who have blood  pressure in the high end of the normal range (130-139/85-89 mm Hg). ? People who are overweight or obese. ? People who are African American.  If you are 56-87 years of age, have your blood pressure checked every 3-5 years. If you are 74 years of age or older, have your blood pressure checked every year. You should have your blood pressure measured twice-once when you are at a hospital or clinic, and once when you are not at a hospital or clinic. Record the average of the two measurements. To check your blood pressure when you are not at a hospital or clinic, you can use: ? An automated blood pressure machine at a pharmacy. ? A home blood pressure monitor.  If you are between 69 years and 79 years old, ask your health care provider if you should take aspirin to prevent strokes.  Have regular diabetes screenings. This involves taking a blood sample to check your fasting blood sugar level. ? If you are at a normal weight and  have a low risk for diabetes, have this test once every three years after 47 years of age. ? If you are overweight and have a high risk for diabetes, consider being tested at a younger age or more often. Preventing infection Hepatitis B  If you have a higher risk for hepatitis B, you should be screened for this virus. You are considered at high risk for hepatitis B if: ? You were born in a country where hepatitis B is common. Ask your health care provider which countries are considered high risk. ? Your parents were born in a high-risk country, and you have not been immunized against hepatitis B (hepatitis B vaccine). ? You have HIV or AIDS. ? You use needles to inject street drugs. ? You live with someone who has hepatitis B. ? You have had sex with someone who has hepatitis B. ? You get hemodialysis treatment. ? You take certain medicines for conditions, including cancer, organ transplantation, and autoimmune conditions.  Hepatitis C  Blood testing is recommended for: ? Everyone born from [birth year] through 1965. ? Anyone with known risk factors for hepatitis C.  Sexually transmitted infections (STIs)  You should be screened for sexually transmitted infections (STIs) including gonorrhea and chlamydia if: ? You are sexually active and are younger than 47 years of age. ? You are older than 47 years of age and your health care provider tells you that you are at risk for this type of infection. ? Your sexual activity has changed since you were last screened and you are at an increased risk for chlamydia or gonorrhea. Ask your health care provider if you are at risk.  If you do not have HIV, but are at risk, it may be recommended that you take a prescription medicine daily to prevent HIV infection. This is called pre-exposure prophylaxis (PrEP). You are considered at risk if: ? You are sexually active and do not regularly use condoms or know the HIV status of your partner(s). ? You take drugs by  injection. ? You are sexually active with a partner who has HIV.  Talk with your health care provider about whether you are at high risk of being infected with HIV. If you choose to begin PrEP, you should first be tested for HIV. You should then be tested every 3 months for as long as you are taking PrEP. Pregnancy  If you are premenopausal and you may become pregnant, ask your health care provider about preconception  counseling.  If you may become pregnant, take 400 to 800 micrograms (mcg) of folic acid every day.  If you want to prevent pregnancy, talk to your health care provider about birth control (contraception). Osteoporosis and menopause  Osteoporosis is a disease in which the bones lose minerals and strength with aging. This can result in serious bone fractures. Your risk for osteoporosis can be identified using a bone density scan.  If you are 63 years of age or older, or if you are at risk for osteoporosis and fractures, ask your health care provider if you should be screened.  Ask your health care provider whether you should take a calcium or vitamin D supplement to lower your risk for osteoporosis.  Menopause may have certain physical symptoms and risks.  Hormone replacement therapy may reduce some of these symptoms and risks. Talk to your health care provider about whether hormone replacement therapy is right for you. Follow these instructions at home:  Schedule regular health, dental, and eye exams.  Stay current with your immunizations.  Do not use any tobacco products including cigarettes, chewing tobacco, or electronic cigarettes.  If you are pregnant, do not drink alcohol.  If you are breastfeeding, limit how much and how often you drink alcohol.  Limit alcohol intake to no more than 1 drink per day for nonpregnant women. One drink equals 12 ounces of beer, 5 ounces of wine, or 1 ounces of hard liquor.  Do not use street drugs.  Do not share needles.  Ask  your health care provider for help if you need support or information about quitting drugs.  Tell your health care provider if you often feel depressed.  Tell your health care provider if you have ever been abused or do not feel safe at home. This information is not intended to replace advice given to you by your health care provider. Make sure you discuss any questions you have with your health care provider. Document Released: 03/15/2011 Document Revised: 02/05/2016 Document Reviewed: 06/03/2015 Elsevier Interactive Patient Education  2018 [employer].  Carbohydrate Counting for Diabetes Mellitus, Adult Carbohydrate counting is a method for keeping track of how many carbohydrates you eat. Eating carbohydrates naturally increases the amount of sugar (glucose) in the blood. Counting how many carbohydrates you eat helps keep your blood glucose within normal limits, which helps you manage your diabetes (diabetes mellitus). It is important to know how many carbohydrates you can safely have in each meal. This is different for every person. A diet and nutrition specialist (registered dietitian) can help you make a meal plan and calculate how many carbohydrates you should have at each meal and snack. Carbohydrates are found in the following foods:  Grains, such as breads and cereals.  Dried beans and soy products.  Starchy vegetables, such as potatoes, peas, and corn.  Fruit and fruit juices.  Milk and yogurt.  Sweets and snack foods, such as cake, cookies, candy, chips, and soft drinks.  How do I count carbohydrates? There are two ways to count carbohydrates in food. You can use either of the methods or a combination of both. Reading "Nutrition Facts" on packaged food The "Nutrition Facts" list is included on the labels of almost all packaged foods and beverages in the [place] It includes:  The serving size.  Information about nutrients in each serving, including the grams (g) of  carbohydrate per serving.  To use the "Nutrition Facts":  Decide how many servings you will have.  Multiply the number  of servings by the number of carbohydrates per serving.  The resulting number is the total amount of carbohydrates that you will be having.  Learning standard serving sizes of other foods When you eat foods containing carbohydrates that are not packaged or do not include "Nutrition Facts" on the label, you need to measure the servings in order to count the amount of carbohydrates:  Measure the foods that you will eat with a food scale or measuring cup, if needed.  Decide how many standard-size servings you will eat.  Multiply the number of servings by 15. Most carbohydrate-rich foods have about 15 g of carbohydrates per serving. ? For example, if you eat 8 oz (170 g) of strawberries, you will have eaten 2 servings and 30 g of carbohydrates (2 servings x 15 g = 30 g).  For foods that have more than one food mixed, such as soups and casseroles, you must count the carbohydrates in each food that is included.  The following list contains standard serving sizes of common carbohydrate-rich foods. Each of these servings has about 15 g of carbohydrates:   hamburger bun or  English muffin.   oz (15 mL) syrup.   oz (14 g) jelly.  1 slice of bread.  1 six-inch tortilla.  3 oz (85 g) cooked rice or pasta.  4 oz (113 g) cooked dried beans.  4 oz (113 g) starchy vegetable, such as peas, corn, or potatoes.  4 oz (113 g) hot cereal.  4 oz (113 g) mashed potatoes or  of a large baked potato.  4 oz (113 g) canned or frozen fruit.  4 oz (120 mL) fruit juice.  4-6 crackers.  6 chicken nuggets.  6 oz (170 g) unsweetened dry cereal.  6 oz (170 g) plain fat-free yogurt or yogurt sweetened with artificial sweeteners.  8 oz (240 mL) milk.  8 oz (170 g) fresh fruit or one small piece of fruit.  24 oz (680 g) popped popcorn.  Example of carbohydrate  counting Sample meal  3 oz (85 g) chicken breast.  6 oz (170 g) brown rice.  4 oz (113 g) corn.  8 oz (240 mL) milk.  8 oz (170 g) strawberries with sugar-free whipped topping. Carbohydrate calculation 1. Identify the foods that contain carbohydrates: ? Rice. ? Corn. ? Milk. ? Strawberries. 2. Calculate how many servings you have of each food: ? 2 servings rice. ? 1 serving corn. ? 1 serving milk. ? 1 serving strawberries. 3. Multiply each number of servings by 15 g: ? 2 servings rice x 15 g = 30 g. ? 1 serving corn x 15 g = 15 g. ? 1 serving milk x 15 g = 15 g. ? 1 serving strawberries x 15 g = 15 g. 4. Add together all of the amounts to find the total grams of carbohydrates eaten: ? 30 g + 15 g + 15 g + 15 g = 75 g of carbohydrates total. This information is not intended to replace advice given to you by your health care provider. Make sure you discuss any questions you have with your health care provider. Document Released: 08/30/2005 Document Revised: 03/19/2016 Document Reviewed: 02/11/2016 Elsevier Interactive Patient Education  2018 [place] After being diagnosed with an anxiety disorder, you may be relieved to know why you have felt or behaved a certain way. It is natural to also feel overwhelmed about the treatment ahead and what it will mean for your life.  With care and support, you can manage this condition and recover from it. How to cope with anxiety Dealing with stress Stress is your body's reaction to life changes and events, both good and bad. Stress can last just a few hours or it can be ongoing. Stress can play a major role in anxiety, so it is important to learn both how to cope with stress and how to think about it differently. Talk with your health care provider or a counselor to learn more about stress reduction. He or she may suggest some stress reduction techniques, such as:  Music therapy. This can include creating or  listening to music that you enjoy and that inspires you.  Mindfulness-based meditation. This involves being aware of your normal breaths, rather than trying to control your breathing. It can be done while sitting or walking.  Centering prayer. This is a kind of meditation that involves focusing on a word, phrase, or sacred image that is meaningful to you and that brings you peace.  Deep breathing. To do this, expand your stomach and inhale slowly through your nose. Hold your breath for 3-5 seconds. Then exhale slowly, allowing your stomach muscles to relax.  Self-talk. This is a skill where you identify thought patterns that lead to anxiety reactions and correct those thoughts.  Muscle relaxation. This involves tensing muscles then relaxing them.  Choose a stress reduction technique that fits your lifestyle and personality. Stress reduction techniques take time and practice. Set aside 5-15 minutes a day to do them. Therapists can offer training in these techniques. The training may be covered by some insurance plans. Other things you can do to manage stress include:  Keeping a stress diary. This can help you learn what triggers your stress and ways to control your response.  Thinking about how you respond to certain situations. You may not be able to control everything, but you can control your reaction.  Making time for activities that help you relax, and not feeling guilty about spending your time in this way.  Therapy combined with coping and stress-reduction skills provides the best chance for successful treatment. Medicines Medicines can help ease symptoms. Medicines for anxiety include:  Anti-anxiety drugs.  Antidepressants.  Beta-blockers.  Medicines may be used as the main treatment for anxiety disorder, along with therapy, or if other treatments are not working. Medicines should be prescribed by a health care provider. Relationships Relationships can play a big part in  helping you recover. Try to spend more time connecting with trusted friends and family members. Consider going to couples counseling, taking family education classes, or going to family therapy. Therapy can help you and others better understand the condition. How to recognize changes in your condition Everyone has a different response to treatment for anxiety. Recovery from anxiety happens when symptoms decrease and stop interfering with your daily activities at home or work. This may mean that you will start to:  Have better concentration and focus.  Sleep better.  Be less irritable.  Have more energy.  Have improved memory.  It is important to recognize when your condition is getting worse. Contact your health care provider if your symptoms interfere with home or work and you do not feel like your condition is improving. Where to find help and support: You can get help and support from these sources:  Self-help groups.  Online and [employer].  A trusted spiritual leader.  Couples counseling.  Family education classes.  Family therapy.  Follow these  instructions at home:  Eat a healthy diet that includes plenty of vegetables, fruits, whole grains, low-fat dairy products, and lean protein. Do not eat a lot of foods that are high in solid fats, added sugars, or salt.  Exercise. Most adults should do the following: ? Exercise for at least 150 minutes each week. The exercise should increase your heart rate and make you sweat (moderate-intensity exercise). ? Strengthening exercises at least twice a week.  Cut down on caffeine, tobacco, alcohol, and other potentially harmful substances.  Get the right amount and quality of sleep. Most adults need 7-9 hours of sleep each night.  Make choices that simplify your life.  Take over-the-counter and prescription medicines only as told by your health care provider.  Avoid caffeine, alcohol, and certain over-the-counter  cold medicines. These may make you feel worse. Ask your pharmacist which medicines to avoid.  Keep all follow-up visits as told by your health care provider. This is important. Questions to ask your health care provider  Would I benefit from therapy?  How often should I follow up with a health care provider?  How long do I need to take medicine?  Are there any long-term side effects of my medicine?  Are there any alternatives to taking medicine? Contact a health care provider if:  You have a hard time staying focused or finishing daily tasks.  You spend many hours a day feeling worried about everyday life.  You become exhausted by worry.  You start to have headaches, feel tense, or have nausea.  You urinate more than normal.  You have diarrhea. Get help right away if:  You have a racing heart and shortness of breath.  You have thoughts of hurting yourself or others. If you ever feel like you may hurt yourself or others, or have thoughts about taking your own life, get help right away. You can go to your nearest emergency department or call:  Your local emergency services (911 in the [place]  A suicide crisis helpline, such as the [facility] at 4092932964. This is open 24-hours a day.  Summary  Taking steps to deal with stress can help calm you.  Medicines cannot cure anxiety disorders, but they can help ease symptoms.  Family, friends, and partners can play a big part in helping you recover from an anxiety disorder. This information is not intended to replace advice given to you by your health care provider. Make sure you discuss any questions you have with your health care provider. Document Released: 08/24/2016 Document Revised: 08/24/2016 Document Reviewed: 08/24/2016 Elsevier Interactive Patient Education  [employer].

## 2018-04-05 NOTE — Progress Notes (Signed)
Denise Byrd Dec 21, 1970 [id number]    History:    Presents for annual exam.  Monthly cycle/BTL.  Anxiety and depression psychiatrist manages.  Normal Pap and mammogram history.  Past medical history, past surgical history, family history and social history were all reviewed and documented in the EPIC chart.  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  Originally [place], moved here as a child.   ROS:  A ROS was performed and pertinent positives and negatives are included.  Exam:  Vitals:   04/05/18 1418  BP: 118/80  Weight: 202 lb (91.6 kg)  Height: 5\' 4"  (1.626 m)   Body mass index is 34.67 kg/m.   General appearance:  Normal Thyroid:  Symmetrical, normal in size, without palpable masses or nodularity. Respiratory  Auscultation:  Clear without wheezing or rhonchi Cardiovascular  Auscultation:  Regular rate, without rubs, murmurs or gallops  Edema/varicosities:  Not grossly evident Abdominal  Soft,nontender, without masses, guarding or rebound.  Liver/spleen:  No organomegaly noted  Hernia:  None appreciated  Skin  Inspection:  Grossly normal history of a tummy tuck   Breasts: Examined lying and sitting.  implants    Right: Without masses, retractions, discharge or axillary adenopathy.     Left: Without masses, retractions, discharge or axillary adenopathy. Gentitourinary   Inguinal/mons:  Normal without inguinal adenopathy  External genitalia:  Normal  BUS/Urethra/Skene's glands:  Normal  Vagina:  Normal  Cervix:  Normal  Uterus:   normal in size, shape and contour.  Midline and mobile  Adnexa/parametria:     Rt: Without masses or tenderness.   Lt: Without masses or tenderness.  Anus and perineum: Normal  Digital rectal exam: Normal sphincter tone without palpated masses or tenderness  Assessment/Plan:  47 y.o. M HF G3 P3 for annual exam.     Monthly cycle/BTL Anxiety/depression/stress-psychiatrist manages meds and  counseling Obesity  Plan: Overdue for follow-up with psychiatrist [facility] information given instructed to call.  Reviewed importance of self-care, leisure activities, daily walking.  SBE's, annual screening mammogram has scheduled, calcium rich foods, vitamin D 1000 daily encouraged.  Encouraged decrease calorie/carbs.  CBC, CMP, Pap normal 2018, new screening guidelines reviewed.    [facility] Byrd [facility], 2:27 PM 04/05/2018

## 2018-04-07 ENCOUNTER — Encounter (INDEPENDENT_AMBULATORY_CARE_PROVIDER_SITE_OTHER): Payer: Self-pay

## 2018-04-11 DIAGNOSIS — Z0289 Encounter for other administrative examinations: Secondary | ICD-10-CM

## 2018-04-19 ENCOUNTER — Ambulatory Visit: Payer: BC Managed Care – PPO

## 2019-04-09 ENCOUNTER — Encounter: Payer: BC Managed Care – PPO | Admitting: Women's Health

## 2019-04-30 ENCOUNTER — Other Ambulatory Visit: Payer: Self-pay

## 2019-05-01 ENCOUNTER — Ambulatory Visit: Payer: BC Managed Care – PPO | Admitting: Women's Health

## 2019-05-01 ENCOUNTER — Encounter: Payer: Self-pay | Admitting: Women's Health

## 2019-05-01 VITALS — BP 118/80 | Ht 64.0 in | Wt 196.0 lb

## 2019-05-01 DIAGNOSIS — Z01419 Encounter for gynecological examination (general) (routine) without abnormal findings: Secondary | ICD-10-CM

## 2019-05-01 DIAGNOSIS — E038 Other specified hypothyroidism: Secondary | ICD-10-CM | POA: Diagnosis not present

## 2019-05-01 DIAGNOSIS — J029 Acute pharyngitis, unspecified: Secondary | ICD-10-CM

## 2019-05-01 NOTE — Progress Notes (Signed)
Denise Byrd Jan 16, 1971 [id number]    History:    Presents for annual exam.  Regular monthly cycle/BTL.  Normal Pap and mammogram history overdue for mammogram.  History of anxiety and depression has seen a counselor and  meds are managed by psychiatrist.  History of hypothyroidism on no medication with normal TSH  Past medical history, past surgical history, family history and social history were all reviewed and documented in the EPIC chart.  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  ROS:  A ROS was performed and pertinent positives and negatives are included.  Exam:  Vitals:   05/01/19 1506  BP: 118/80  Weight: 196 lb (88.9 kg)  Height: 5\' 4"  (1.626 m)   Body mass index is 33.64 kg/m.   General appearance:  Normal Thyroid:  Symmetrical, normal in size, without palpable masses or nodularity. Respiratory  Auscultation:  Clear without wheezing or rhonchi Cardiovascular  Auscultation:  Regular rate, without rubs, murmurs or gallops  Edema/varicosities:  Not grossly evident Abdominal  Soft,nontender, without masses, guarding or rebound.  Liver/spleen:  No organomegaly noted  Hernia:  None appreciated  Skin  Inspection:  Grossly normal   Breasts: Examined lying and sitting.     Right: Without masses, retractions, discharge or axillary adenopathy.     Left: Without masses, retractions, discharge or axillary adenopathy. Gentitourinary   Inguinal/mons:  Normal without inguinal adenopathy  External genitalia:  Normal  BUS/Urethra/Skene's glands:  Normal  Vagina:  Normal  Cervix:  Normal  Uterus:   normal in size, shape and contour.  Midline and mobile  Adnexa/parametria:     Rt: Without masses or tenderness.   Lt: Without masses or tenderness.  Anus and perineum: Normal  Digital rectal exam: Normal sphincter tone without palpated masses or  tenderness  Assessment/Plan:  48 y.o. M HF G3, P3 for annual exam with complaint of sore throat for 1 day, no fever and fullness in the ears.    Regular monthly cycle/BTL Anxiety/depression-psychiatrist manages meds Obesity  Plan: SBEs, reviewed importance of annual screening mammogram, breast center information given instructed to schedule.  Increase regular cardio type exercise, decrease calorie/carbs encouraged.  Self-care, leisure activities encouraged..  Reviewed ears clear bilaterally, throat clear without exudate, strep culture taken and is pending.  Reviewed importance of treating symptoms, increasing fluids and rest, and follow-up with primary care if continued problems.  Vitamin D 2000 daily encouraged.  CBC, CMP, TSH, Pap normal 2018, new screening guidelines reviewed.Huel Cote [facility], 3:16 PM 05/01/2019

## 2019-05-01 NOTE — Patient Instructions (Addendum)
[phone] [facility] center on [address]  Mammogram  OTC  Robitussin and motrin  Health Maintenance, Female Adopting a healthy lifestyle and getting preventive care are important in promoting health and wellness. Ask your health care provider about:  The right schedule for you to have regular tests and exams.  Things you can do on your own to prevent diseases and keep yourself healthy. What should I know about diet, weight, and exercise? Eat a healthy diet   Eat a diet that includes plenty of vegetables, fruits, low-fat dairy products, and lean protein.  Do not eat a lot of foods that are high in solid fats, added sugars, or sodium. Maintain a healthy weight Body mass index (BMI) is used to identify weight problems. It estimates body fat based on height and weight. Your health care provider can help determine your BMI and help you achieve or maintain a healthy weight. Get regular exercise Get regular exercise. This is one of the most important things you can do for your health. Most adults should:  Exercise for at least 150 minutes each week. The exercise should increase your heart rate and make you sweat (moderate-intensity exercise).  Do strengthening exercises at least twice a week. This is in addition to the moderate-intensity exercise.  Spend less time sitting. Even light physical activity can be beneficial. Watch cholesterol and blood lipids Have your blood tested for lipids and cholesterol at 48 years of age, then have this test every 5 years. Have your cholesterol levels checked more often if:  Your lipid or cholesterol levels are high.  You are older than 48 years of age.  You are at high risk for heart disease. What should I know about cancer screening? Depending on your health history and family history, you may need to have cancer screening at various ages. This may include screening for:  Breast cancer.  Cervical cancer.  Colorectal cancer.  Skin cancer.   Lung cancer. What should I know about heart disease, diabetes, and high blood pressure? Blood pressure and heart disease  High blood pressure causes heart disease and increases the risk of stroke. This is more likely to develop in people who have high blood pressure readings, are of African descent, or are overweight.  Have your blood pressure checked: ? Every 3-5 years if you are 73-37 years of age. ? Every year if you are 82 years old or older. Diabetes Have regular diabetes screenings. This checks your fasting blood sugar level. Have the screening done:  Once every three years after age 73 if you are at a normal weight and have a low risk for diabetes.  More often and at a younger age if you are overweight or have a high risk for diabetes. What should I know about preventing infection? Hepatitis B If you have a higher risk for hepatitis B, you should be screened for this virus. Talk with your health care provider to find out if you are at risk for hepatitis B infection. Hepatitis C Testing is recommended for:  Everyone born from [birth year] through 1965.  Anyone with known risk factors for hepatitis C. Sexually transmitted infections (STIs)  Get screened for STIs, including gonorrhea and chlamydia, if: ? You are sexually active and are younger than 48 years of age. ? You are older than 48 years of age and your health care provider tells you that you are at risk for this type of infection. ? Your sexual activity has changed since you were last screened,  and you are at increased risk for chlamydia or gonorrhea. Ask your health care provider if you are at risk.  Ask your health care provider about whether you are at high risk for HIV. Your health care provider may recommend a prescription medicine to help prevent HIV infection. If you choose to take medicine to prevent HIV, you should first get tested for HIV. You should then be tested every 3 months for as long as you are taking the  medicine. Pregnancy  If you are about to stop having your period (premenopausal) and you may become pregnant, seek counseling before you get pregnant.  Take 400 to 800 micrograms (mcg) of folic acid every day if you become pregnant.  Ask for birth control (contraception) if you want to prevent pregnancy. Osteoporosis and menopause Osteoporosis is a disease in which the bones lose minerals and strength with aging. This can result in bone fractures. If you are 16 years old or older, or if you are at risk for osteoporosis and fractures, ask your health care provider if you should:  Be screened for bone loss.  Take a calcium or vitamin D supplement to lower your risk of fractures.  Be given hormone replacement therapy (HRT) to treat symptoms of menopause. Follow these instructions at home: Lifestyle  Do not use any products that contain nicotine or tobacco, such as cigarettes, e-cigarettes, and chewing tobacco. If you need help quitting, ask your health care provider.  Do not use street drugs.  Do not share needles.  Ask your health care provider for help if you need support or information about quitting drugs. Alcohol use  Do not drink alcohol if: ? Your health care provider tells you not to drink. ? You are pregnant, may be pregnant, or are planning to become pregnant.  If you drink alcohol: ? Limit how much you use to 0-1 drink a day. ? Limit intake if you are breastfeeding.  Be aware of how much alcohol is in your drink. In the [place] one drink equals one 12 oz bottle of beer (355 mL), one 5 oz glass of wine (148 mL), or one 1 oz glass of hard liquor (44 mL). General instructions  Schedule regular health, dental, and eye exams.  Stay current with your vaccines.  Tell your health care provider if: ? You often feel depressed. ? You have ever been abused or do not feel safe at home. Summary  Adopting a healthy lifestyle and getting preventive care are important in  promoting health and wellness.  Follow your health care provider's instructions about healthy diet, exercising, and getting tested or screened for diseases.  Follow your health care provider's instructions on monitoring your cholesterol and blood pressure. This information is not intended to replace advice given to you by your health care provider. Make sure you discuss any questions you have with your health care provider. Document Released: 03/15/2011 Document Revised: 08/23/2018 Document Reviewed: 08/23/2018 Elsevier Patient Education  2020 [employer].

## 2019-05-02 ENCOUNTER — Encounter: Payer: Self-pay | Admitting: Women's Health

## 2019-05-02 LAB — COMPREHENSIVE METABOLIC PANEL
AG Ratio: 1.7 (calc) (ref 1.0–2.5)
ALT: 12 U/L (ref 6–29)
AST: 17 U/L (ref 10–35)
Albumin: 4.3 g/dL (ref 3.6–5.1)
Alkaline phosphatase (APISO): 61 U/L (ref 31–125)
BUN: 22 mg/dL (ref 7–25)
CO2: 26 mmol/L (ref 20–32)
Calcium: 9.6 mg/dL (ref 8.6–10.2)
Chloride: 104 mmol/L (ref 98–110)
Creat: 0.94 mg/dL (ref 0.50–1.10)
Globulin: 2.6 g/dL (calc) (ref 1.9–3.7)
Glucose, Bld: 87 mg/dL (ref 65–99)
Potassium: 4.3 mmol/L (ref 3.5–5.3)
Sodium: 137 mmol/L (ref 135–146)
Total Bilirubin: 0.3 mg/dL (ref 0.2–1.2)
Total Protein: 6.9 g/dL (ref 6.1–8.1)

## 2019-05-02 LAB — CBC WITH DIFFERENTIAL/PLATELET
Absolute Monocytes: 493 cells/uL (ref 200–950)
Basophils Absolute: 17 cells/uL (ref 0–200)
Basophils Relative: 0.3 %
Eosinophils Absolute: 133 cells/uL (ref 15–500)
Eosinophils Relative: 2.3 %
HCT: 40.4 % (ref 35.0–45.0)
Hemoglobin: 13.5 g/dL (ref 11.7–15.5)
Lymphs Abs: 2042 cells/uL (ref 850–3900)
MCH: 30.8 pg (ref 27.0–33.0)
MCHC: 33.4 g/dL (ref 32.0–36.0)
MCV: 92.2 fL (ref 80.0–100.0)
MPV: 10.6 fL (ref 7.5–12.5)
Monocytes Relative: 8.5 %
Neutro Abs: 3115 cells/uL (ref 1500–7800)
Neutrophils Relative %: 53.7 %
Platelets: 249 10*3/uL (ref 140–400)
RBC: 4.38 10*6/uL (ref 3.80–5.10)
RDW: 12.8 % (ref 11.0–15.0)
Total Lymphocyte: 35.2 %
WBC: 5.8 10*3/uL (ref 3.8–10.8)

## 2019-05-02 LAB — TSH: TSH: 3.83 mIU/L

## 2019-05-02 LAB — STREP A DNA PROBE: Group A Strep Probe: NOT DETECTED

## 2020-06-17 ENCOUNTER — Other Ambulatory Visit: Payer: Self-pay

## 2020-06-17 ENCOUNTER — Encounter: Payer: Self-pay | Admitting: Nurse Practitioner

## 2020-06-17 ENCOUNTER — Ambulatory Visit: Payer: BC Managed Care – PPO | Admitting: Nurse Practitioner

## 2020-06-17 VITALS — BP 122/80 | Ht 64.0 in | Wt 222.0 lb

## 2020-06-17 DIAGNOSIS — Z8639 Personal history of other endocrine, nutritional and metabolic disease: Secondary | ICD-10-CM

## 2020-06-17 DIAGNOSIS — Z01419 Encounter for gynecological examination (general) (routine) without abnormal findings: Secondary | ICD-10-CM | POA: Diagnosis not present

## 2020-06-17 DIAGNOSIS — Z7689 Persons encountering health services in other specified circumstances: Secondary | ICD-10-CM | POA: Diagnosis not present

## 2020-06-17 MED ORDER — PHENTERMINE HCL 30 MG PO CAPS: 30.0000 mg | ORAL_CAPSULE | ORAL | 0 refills | Status: DC

## 2020-06-17 NOTE — Patient Instructions (Addendum)
Schedule mammogram! [facility] [phone] [address]  [place], [place] [address]  Health Maintenance, Female Adopting a healthy lifestyle and getting preventive care are important in promoting health and wellness. Ask your health care provider about:  The right schedule for you to have regular tests and exams.  Things you can do on your own to prevent diseases and keep yourself healthy. What should I know about diet, weight, and exercise? Eat a healthy diet   Eat a diet that includes plenty of vegetables, fruits, low-fat dairy products, and lean protein.  Do not eat a lot of foods that are high in solid fats, added sugars, or sodium. Maintain a healthy weight Body mass index (BMI) is used to identify weight problems. It estimates body fat based on height and weight. Your health care provider can help determine your BMI and help you achieve or maintain a healthy weight. Get regular exercise Get regular exercise. This is one of the most important things you can do for your health. Most adults should:  Exercise for at least 150 minutes each week. The exercise should increase your heart rate and make you sweat (moderate-intensity exercise).  Do strengthening exercises at least twice a week. This is in addition to the moderate-intensity exercise.  Spend less time sitting. Even light physical activity can be beneficial. Watch cholesterol and blood lipids Have your blood tested for lipids and cholesterol at 49 years of age, then have this test every 5 years. Have your cholesterol levels checked more often if:  Your lipid or cholesterol levels are high.  You are older than 49 years of age.  You are at high risk for heart disease. What should I know about cancer screening? Depending on your health history and family history, you may need to have cancer screening at various ages. This may include screening for:  Breast cancer.  Cervical  cancer.  Colorectal cancer.  Skin cancer.  Lung cancer. What should I know about heart disease, diabetes, and high blood pressure? Blood pressure and heart disease  High blood pressure causes heart disease and increases the risk of stroke. This is more likely to develop in people who have high blood pressure readings, are of African descent, or are overweight.  Have your blood pressure checked: ? Every 3-5 years if you are 18-39 years of age. ? Every year if you are 40 years old or older. Diabetes Have regular diabetes screenings. This checks your fasting blood sugar level. Have the screening done:  Once every three years after age 40 if you are at a normal weight and have a low risk for diabetes.  More often and at a younger age if you are overweight or have a high risk for diabetes. What should I know about preventing infection? Hepatitis B If you have a higher risk for hepatitis B, you should be screened for this virus. Talk with your health care provider to find out if you are at risk for hepatitis B infection. Hepatitis C Testing is recommended for:  Everyone born from 1945 through 1965.  Anyone with known risk factors for hepatitis C. Sexually transmitted infections (STIs)  Get screened for STIs, including gonorrhea and chlamydia, if: ? You are sexually active and are younger than 49 years of age. ? You are older than 49 years of age and your health care provider tells you that you are at risk for this type of infection. ? Your sexual activity has changed since you   were last screened, and you are at increased risk for chlamydia or gonorrhea. Ask your health care provider if you are at risk.  Ask your health care provider about whether you are at high risk for HIV. Your health care provider may recommend a prescription medicine to help prevent HIV infection. If you choose to take medicine to prevent HIV, you should first get tested for HIV. You should then be tested every 3  months for as long as you are taking the medicine. Pregnancy  If you are about to stop having your period (premenopausal) and you may become pregnant, seek counseling before you get pregnant.  Take 400 to 800 micrograms (mcg) of folic acid every day if you become pregnant.  Ask for birth control (contraception) if you want to prevent pregnancy. Osteoporosis and menopause Osteoporosis is a disease in which the bones lose minerals and strength with aging. This can result in bone fractures. If you are 65 years old or older, or if you are at risk for osteoporosis and fractures, ask your health care provider if you should:  Be screened for bone loss.  Take a calcium or vitamin D supplement to lower your risk of fractures.  Be given hormone replacement therapy (HRT) to treat symptoms of menopause. Follow these instructions at home: Lifestyle  Do not use any products that contain nicotine or tobacco, such as cigarettes, e-cigarettes, and chewing tobacco. If you need help quitting, ask your health care provider.  Do not use street drugs.  Do not share needles.  Ask your health care provider for help if you need support or information about quitting drugs. Alcohol use  Do not drink alcohol if: ? Your health care provider tells you not to drink. ? You are pregnant, may be pregnant, or are planning to become pregnant.  If you drink alcohol: ? Limit how much you use to 0-1 drink a day. ? Limit intake if you are breastfeeding.  Be aware of how much alcohol is in your drink. In the [place] one drink equals one 12 oz bottle of beer (355 mL), one 5 oz glass of wine (148 mL), or one 1 oz glass of hard liquor (44 mL). General instructions  Schedule regular health, dental, and eye exams.  Stay current with your vaccines.  Tell your health care provider if: ? You often feel depressed. ? You have ever been abused or do not feel safe at home. Summary  Adopting a healthy lifestyle and getting  preventive care are important in promoting health and wellness.  Follow your health care provider's instructions about healthy diet, exercising, and getting tested or screened for diseases.  Follow your health care provider's instructions on monitoring your cholesterol and blood pressure. This information is not intended to replace advice given to you by your health care provider. Make sure you discuss any questions you have with your health care provider. Document Revised: 08/23/2018 Document Reviewed: 08/23/2018 Elsevier Patient Education  2020 [employer] Inc[employer]  

## 2020-06-17 NOTE — Progress Notes (Signed)
Denise Byrd [birth year] [id number]   History:  49 y.o. G3P3003 presents for annual exam. Regular monthly cycle. BTL. Normal pap and mammogram history. Anxiety and depression managed by psychiatry. History of Vitamin D deficiency. Has been struggling with weight gain. 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 She was on Phentermine once in the past and did well with it and would like to try this again. Complains of a mild sore throat and headache, was tested 3 days ago for Covid and was negative. She is aware we do not test for strep in our office.  Gynecologic History Patient's last menstrual period was 06/13/2020. Period Cycle (Days): 28 Period Duration (Days): 5 Period Pattern: Regular Menstrual Flow: Moderate Menstrual Control: Maxi pad, Tampon Dysmenorrhea: (!) Mild Dysmenorrhea Symptoms: Cramping Contraception: tubal ligation Last Pap: 11/03/2016. Results were: normal Last mammogram: 11/25/2016. Results were: normal   Past medical history, past surgical history, family history and social history were all reviewed and documented in the EPIC chart.  ROS:  A ROS was performed and pertinent positives and negatives are included.  Exam:  Vitals:   06/17/20 1501  BP: 122/80  Weight: 222 lb (100.7 kg)  Height: 5\' 4"  (1.626 m)   Body mass index is 38.11 kg/m.  General appearance:  Normal Thyroid:  Symmetrical, normal in size, without palpable masses or nodularity. Respiratory  Auscultation:  Clear without wheezing or rhonchi Cardiovascular  Auscultation:  Regular rate, without rubs, murmurs or gallops  Edema/varicosities:  Not grossly evident Abdominal  Soft,nontender, without masses, guarding or rebound.  Liver/spleen:  No organomegaly noted  Hernia:  None appreciated  Skin  Inspection:  Grossly normal   Breasts: Examined lying and sitting. Bilateral implants  Right: Without masses, retractions, discharge or axillary  adenopathy.   Left: Without masses, retractions, discharge or axillary adenopathy. Gentitourinary   Inguinal/mons:  Normal without inguinal adenopathy  External genitalia:  Normal  BUS/Urethra/Skene's glands:  Normal  Vagina:  Normal  Cervix:  Normal  Uterus:  Difficult to palpate due to body habitus but no gross masses or tenderness  Adnexa/parametria:     Rt: Without masses or tenderness.   Lt: Without masses or tenderness.  Anus and perineum: Normal  Assessment/Plan:  49 y.o. 54 for annual exam.   Well female exam with routine gynecological exam - Plan: CBC with Differential/Platelet, Comprehensive metabolic panel. Education provided on SBEs, importance of preventative screenings, current guidelines, high calcium diet, regular exercise, and multivitamin daily.   History of hypothyroidism - Plan: TSH. Not on hormones. Complains of weight gain.   History of vitamin D deficiency - Plan: VITAMIN D 25 Hydroxy (Vit-D Deficiency, Fractures). 27 in 2017.   Encounter for weight management - Plan: phentermine 30 MG capsule daily for 30 days. She is aware this is a short-term medication and will not be refilled. Purpose of medication is to help suppress appetite to help her develop healthy eating habits.  Also encouraged to find ways to stay active despite knee pain such as elliptical or stationary bike.   Screening for cervical cancer - normal pap history. Will repeat at 5 year interval per guidelines.   Screening for breast cancer - normal mammogram history. Overdue for screening mammogram. Discussed current guidelines and importance of preventative screenings. Information provided on the breast center. She plans to schedule this soon.   Follow up in 1 year for annual.         2018 [facility], 3:15 PM 06/17/2020

## 2020-06-18 ENCOUNTER — Other Ambulatory Visit: Payer: Self-pay | Admitting: Nurse Practitioner

## 2020-06-18 DIAGNOSIS — E039 Hypothyroidism, unspecified: Secondary | ICD-10-CM

## 2020-06-18 DIAGNOSIS — E559 Vitamin D deficiency, unspecified: Secondary | ICD-10-CM

## 2020-06-18 LAB — COMPREHENSIVE METABOLIC PANEL
AG Ratio: 1.4 (calc) (ref 1.0–2.5)
ALT: 15 U/L (ref 6–29)
AST: 18 U/L (ref 10–35)
Albumin: 4.2 g/dL (ref 3.6–5.1)
Alkaline phosphatase (APISO): 76 U/L (ref 31–125)
BUN: 19 mg/dL (ref 7–25)
CO2: 25 mmol/L (ref 20–32)
Calcium: 9.4 mg/dL (ref 8.6–10.2)
Chloride: 103 mmol/L (ref 98–110)
Creat: 0.8 mg/dL (ref 0.50–1.10)
Globulin: 3 g/dL (calc) (ref 1.9–3.7)
Glucose, Bld: 81 mg/dL (ref 65–99)
Potassium: 4.3 mmol/L (ref 3.5–5.3)
Sodium: 137 mmol/L (ref 135–146)
Total Bilirubin: 0.5 mg/dL (ref 0.2–1.2)
Total Protein: 7.2 g/dL (ref 6.1–8.1)

## 2020-06-18 LAB — CBC WITH DIFFERENTIAL/PLATELET
Absolute Monocytes: 632 cells/uL (ref 200–950)
Basophils Absolute: 32 cells/uL (ref 0–200)
Basophils Relative: 0.4 %
Eosinophils Absolute: 56 cells/uL (ref 15–500)
Eosinophils Relative: 0.7 %
HCT: 41.7 % (ref 35.0–45.0)
Hemoglobin: 14.4 g/dL (ref 11.7–15.5)
Lymphs Abs: 2344 cells/uL (ref 850–3900)
MCH: 31.3 pg (ref 27.0–33.0)
MCHC: 34.5 g/dL (ref 32.0–36.0)
MCV: 90.7 fL (ref 80.0–100.0)
MPV: 9.9 fL (ref 7.5–12.5)
Monocytes Relative: 7.9 %
Neutro Abs: 4936 cells/uL (ref 1500–7800)
Neutrophils Relative %: 61.7 %
Platelets: 236 10*3/uL (ref 140–400)
RBC: 4.6 10*6/uL (ref 3.80–5.10)
RDW: 13.2 % (ref 11.0–15.0)
Total Lymphocyte: 29.3 %
WBC: 8 10*3/uL (ref 3.8–10.8)

## 2020-06-18 LAB — TSH: TSH: 8.36 mIU/L — ABNORMAL HIGH

## 2020-06-18 LAB — VITAMIN D 25 HYDROXY (VIT D DEFICIENCY, FRACTURES): Vit D, 25-Hydroxy: 20 ng/mL — ABNORMAL LOW (ref 30–100)

## 2020-06-18 MED ORDER — VITAMIN D (ERGOCALCIFEROL) 1.25 MG (50000 UNIT) PO CAPS: 50000.0000 [IU] | ORAL_CAPSULE | ORAL | 0 refills | Status: AC

## 2020-06-19 ENCOUNTER — Other Ambulatory Visit: Payer: BC Managed Care – PPO

## 2020-06-19 ENCOUNTER — Other Ambulatory Visit: Payer: Self-pay

## 2020-06-19 DIAGNOSIS — E039 Hypothyroidism, unspecified: Secondary | ICD-10-CM

## 2020-06-19 DIAGNOSIS — E559 Vitamin D deficiency, unspecified: Secondary | ICD-10-CM

## 2020-06-20 LAB — VITAMIN D 25 HYDROXY (VIT D DEFICIENCY, FRACTURES): Vit D, 25-Hydroxy: 42 ng/mL (ref 30–100)

## 2020-06-20 LAB — THYROID PANEL WITH TSH
Free Thyroxine Index: 1.7 (ref 1.4–3.8)
T3 Uptake: 29 % (ref 22–35)
T4, Total: 6 ug/dL (ref 5.1–11.9)
TSH: 6.19 mIU/L — ABNORMAL HIGH

## 2020-06-23 ENCOUNTER — Other Ambulatory Visit: Payer: Self-pay

## 2020-06-23 DIAGNOSIS — R7989 Other specified abnormal findings of blood chemistry: Secondary | ICD-10-CM

## 2020-09-02 ENCOUNTER — Other Ambulatory Visit: Payer: Self-pay | Admitting: Nurse Practitioner

## 2020-09-02 DIAGNOSIS — E559 Vitamin D deficiency, unspecified: Secondary | ICD-10-CM

## 2020-09-16 ENCOUNTER — Emergency Department (HOSPITAL_BASED_OUTPATIENT_CLINIC_OR_DEPARTMENT_OTHER)
Admission: EM | Admit: 2020-09-16 | Discharge: 2020-09-16 | Disposition: A | Discharge status: Home / Self Care | Payer: BC Managed Care – PPO | Attending: Emergency Medicine | Admitting: Emergency Medicine

## 2020-09-16 ENCOUNTER — Encounter (HOSPITAL_BASED_OUTPATIENT_CLINIC_OR_DEPARTMENT_OTHER): Payer: Self-pay

## 2020-09-16 ENCOUNTER — Other Ambulatory Visit: Payer: Self-pay

## 2020-09-16 ENCOUNTER — Emergency Department (HOSPITAL_BASED_OUTPATIENT_CLINIC_OR_DEPARTMENT_OTHER): Payer: BC Managed Care – PPO

## 2020-09-16 DIAGNOSIS — U071 COVID-19: Secondary | ICD-10-CM

## 2020-09-16 DIAGNOSIS — J029 Acute pharyngitis, unspecified: Secondary | ICD-10-CM | POA: Diagnosis present

## 2020-09-16 LAB — CBC WITH DIFFERENTIAL/PLATELET
Abs Immature Granulocytes: 0.01 10*3/uL (ref 0.00–0.07)
Basophils Absolute: 0 10*3/uL (ref 0.0–0.1)
Basophils Relative: 0 %
Eosinophils Absolute: 0.1 10*3/uL (ref 0.0–0.5)
Eosinophils Relative: 1 %
HCT: 43.9 % (ref 36.0–46.0)
Hemoglobin: 15.1 g/dL — ABNORMAL HIGH (ref 12.0–15.0)
Immature Granulocytes: 0 %
Lymphocytes Relative: 31 %
Lymphs Abs: 2 10*3/uL (ref 0.7–4.0)
MCH: 30.8 pg (ref 26.0–34.0)
MCHC: 34.4 g/dL (ref 30.0–36.0)
MCV: 89.6 fL (ref 80.0–100.0)
Monocytes Absolute: 0.5 10*3/uL (ref 0.1–1.0)
Monocytes Relative: 7 %
Neutro Abs: 3.9 10*3/uL (ref 1.7–7.7)
Neutrophils Relative %: 61 %
Platelets: 207 10*3/uL (ref 150–400)
RBC: 4.9 MIL/uL (ref 3.87–5.11)
RDW: 12.2 % (ref 11.5–15.5)
WBC: 6.6 10*3/uL (ref 4.0–10.5)
nRBC: 0 % (ref 0.0–0.2)

## 2020-09-16 LAB — URINALYSIS, MICROSCOPIC (REFLEX)

## 2020-09-16 LAB — URINALYSIS, ROUTINE W REFLEX MICROSCOPIC
Bilirubin Urine: NEGATIVE
Glucose, UA: NEGATIVE mg/dL
Ketones, ur: NEGATIVE mg/dL
Leukocytes,Ua: NEGATIVE
Nitrite: NEGATIVE
Protein, ur: NEGATIVE mg/dL
Specific Gravity, Urine: 1.015 (ref 1.005–1.030)
pH: 6 (ref 5.0–8.0)

## 2020-09-16 LAB — BASIC METABOLIC PANEL
Anion gap: 8 (ref 5–15)
BUN: 17 mg/dL (ref 6–20)
CO2: 27 mmol/L (ref 22–32)
Calcium: 9.2 mg/dL (ref 8.9–10.3)
Chloride: 102 mmol/L (ref 98–111)
Creatinine, Ser: 0.78 mg/dL (ref 0.44–1.00)
GFR, Estimated: >60 mL/min (ref >60)
Glucose, Bld: 94 mg/dL (ref 70–99)
Potassium: 4.2 mmol/L (ref 3.5–5.1)
Sodium: 137 mmol/L (ref 135–145)

## 2020-09-16 MED ORDER — ONDANSETRON 4 MG PO TBDP: 4.0000 mg | ORAL_TABLET | Freq: Three times a day (TID) | ORAL | 0 refills | Status: AC | PRN

## 2020-09-16 MED ORDER — ONDANSETRON 4 MG PO TBDP
4.0000 mg | ORAL_TABLET | Freq: Once | ORAL | Status: AC
  Administered 2020-09-16: 4 mg via ORAL
  Filled 2020-09-16: qty 1

## 2020-09-16 NOTE — ED Provider Notes (Signed)
[facility] EMERGENCY DEPARTMENT Provider Note   CSN: [id number] Arrival date & time: 09/16/20  1347     History Chief Complaint  Patient presents with   Covid Positive    Denise Byrd is a 50 y.o. female with no significant past medical history presents the ED with ongoing COVID-19 symptoms.  Patient reports that she first became symptomatic on 09/11/2020 and then went to an urgent care in [facility], and see where she tested positive for COVID-19 on 09/12/2020.  Her 5 days were up and based on current CDC guidelines, she was supposed to return to work today.  However, she spoke with her superintendent/principal who encouraged her to come to the ED for evaluation given her persistent if not worsening COVID-19 symptoms.  She states that she has been experiencing sinus congestion, sore throat, cough, nausea, and emesis.  She states that she has been having difficulty eating and drinking and has already lost several pounds.  She has been taking over-the-counter medications, with some relief.  She states that her fevers are well controlled at home, none recently.  She denies any history of clots or clotting disorder, unilateral extremity swelling or edema, chest pain or difficulty breathing, hemoptysis, room spinning dizziness or other neurologic symptoms, hematemesis, abdominal pain, or urinary symptoms.  Patient was fully vaccinated for COVID-19, but back in March 2021.  No booster.  HPI     History reviewed. No pertinent past medical history.  There are no problems to display for this patient.   History reviewed. No pertinent surgical history.   OB History   No obstetric history on file.     No family history on file.  Social History   Tobacco Use   Smoking status: Never Smoker   Smokeless tobacco: Never Used  Vaping Use   Vaping Use: Never used  Substance Use Topics   Alcohol use: Never   Drug use: Never    Home Medications Prior to Admission  medications   Medication Sig Start Date End Date Taking? Authorizing Provider  ondansetron (ZOFRAN ODT) 4 MG disintegrating tablet Take 1 tablet (4 mg total) by mouth every 8 (eight) hours as needed for nausea or vomiting. 09/16/20  Yes Corena Herter, PA-C    Allergies    Patient has no known allergies.  Review of Systems   Review of Systems  All other systems reviewed and are negative.   Physical Exam Updated Vital Signs BP (!) 133/92 (BP Location: Right Arm)    Pulse 81    Temp 98.3 F (36.8 C) (Oral)    Resp 16    Ht 5\' 5"  (1.651 m)    Wt 93.4 kg    LMP 09/13/2020    SpO2 100%    BMI 34.28 kg/m   Physical Exam Vitals and nursing note reviewed. Exam conducted with a chaperone present.  Constitutional:      General: She is not in acute distress.    Appearance: She is not toxic-appearing.  HENT:     Head: Normocephalic and atraumatic.  Eyes:     General: No scleral icterus.    Conjunctiva/sclera: Conjunctivae normal.  Cardiovascular:     Rate and Rhythm: Normal rate and regular rhythm.     Pulses: Normal pulses.  Pulmonary:     Effort: Pulmonary effort is normal. No respiratory distress.     Breath sounds: Normal breath sounds. No wheezing or rales.     Comments: CTA bilaterally.  Symmetric chest rise.  No  increased work of breathing.  100% on RA here at rest.  No tachypnea. Abdominal:     General: Abdomen is flat. There is no distension.     Palpations: Abdomen is soft.     Tenderness: There is no abdominal tenderness. There is no guarding.     Comments: Soft, nondistended.  No areas of tenderness.  No guarding.  Musculoskeletal:     Right lower leg: No edema.     Left lower leg: No edema.  Skin:    General: Skin is dry.  Neurological:     Mental Status: She is alert and oriented to person, place, and time.     GCS: GCS eye subscore is 4. GCS verbal subscore is 5. GCS motor subscore is 6.  Psychiatric:        Mood and Affect: Mood normal.        Behavior: Behavior  normal.        Thought Content: Thought content normal.     ED Results / Procedures / Treatments   Labs (all labs ordered are listed, but only abnormal results are displayed) Labs Reviewed  CBC WITH DIFFERENTIAL/PLATELET - Abnormal; Notable for the following components:      Result Value   Hemoglobin 15.1 (*)    All other components within normal limits  URINALYSIS, ROUTINE W REFLEX MICROSCOPIC - Abnormal; Notable for the following components:   Hgb urine dipstick SMALL (*)    All other components within normal limits  URINALYSIS, MICROSCOPIC (REFLEX) - Abnormal; Notable for the following components:   Bacteria, UA FEW (*)    All other components within normal limits  BASIC METABOLIC PANEL    EKG None  Radiology DG Chest Portable 1 View  Result Date: 09/16/2020 CLINICAL DATA:  50 year old female with shortness of breath, COVID EXAM: PORTABLE CHEST 1 VIEW COMPARISON:  None. FINDINGS: The heart size and mediastinal contours are within normal limits. Both lungs are clear. The visualized skeletal structures are unremarkable. IMPRESSION: Negative for acute cardiopulmonary disease Electronically Signed   By: Gilmer Mor D.O.   On: 09/16/2020 15:25    Procedures Procedures (including critical care time)  Medications Ordered in ED Medications  ondansetron (ZOFRAN-ODT) disintegrating tablet 4 mg (4 mg Oral Given 09/16/20 1844)    ED Course  I have reviewed the triage vital signs and the nursing notes.  Pertinent labs & imaging results that were available during my care of the patient were reviewed by me and considered in my medical decision making (see chart for details).    MDM Rules/Calculators/A&P                          Patient with symptoms of COVID-19 x 5 days.  Patient's CXR is negative for acute infiltrate.  Symptoms are consistent with her positive COVID-19 testing obtained 09/12/2020.  Discussed that antibiotics are not indicated for viral infections.  Patient will  be discharged with symptomatic treatment.  Patient is tolerating food and liquid without difficulty and I do not believe that laboratory work-up would yield any significant findings.  I emphasized the importance of rest, continued oral hydration, and antipyretics as needed for fever control.    We will provide patient with Zofran ODT here in the ED and p.o. challenge.  Given her reports that she has had significant nausea and emesis in conjunction with diminished appetite in the setting of her COVID-19 infection.  We will discharge her home with continued Zofran  ODT.  They were provided opportunity to ask any additional questions and have none at this time.  Prior to discharge patient is feeling well, agreeable with plan for discharge home.  They have expressed understanding of verbal discharge instructions as well as return precautions and are agreeable to the plan.   Denise Byrd was evaluated in Emergency Department on 09/16/2020 for the symptoms described in the history of present illness. She was evaluated in the context of the global COVID-19 pandemic, which necessitated consideration that the patient might be at risk for infection with the SARS-CoV-2 virus that causes COVID-19. Institutional protocols and algorithms that pertain to the evaluation of patients at risk for COVID-19 are in a state of rapid change based on information released by regulatory bodies including the CDC and federal and state organizations. These policies and algorithms were followed during the patient's care in the ED.   Final Clinical Impression(s) / ED Diagnoses Final diagnoses:  COVID-19    Rx / DC Orders ED Discharge Orders         Ordered    ondansetron (ZOFRAN ODT) 4 MG disintegrating tablet  Every 8 hours PRN        09/16/20 1916           Lorelee New, PA-C 09/16/20 1916    Milagros Loll, MD 09/23/20 [id number]

## 2020-09-16 NOTE — ED Triage Notes (Signed)
Pt states she tested +covid 12/31-c/o n/v/d, sore throat, SOB-NAD-steady gait

## 2020-09-16 NOTE — Discharge Instructions (Signed)
Your symptoms are consistent with your recent positive COVID-19 test.  Please continue to maintain isolation precautions.  Check your temperature regularly and take Tylenol as needed for fever control.  Increase your oral hydration and continue to eat regular meals.  I recommend over-the-counter medications as needed for symptom relief.    I have prescribed you Zofran to help with your nausea symptoms.  Please take, as needed.  It is vitally important that you continue to eat and drink.  Your exam here in the ED and work-up was entirely reassuring.  Follow-up with your primary care provider regarding today's encounter and for ongoing management.  If you do not have one, please get established with one as soon as possible.  If you do not have insurance, please consider the [facility] and [facility].  Return to the ED or seek immediate medical attention should you experience any new or worsening symptoms.

## 2020-09-16 NOTE — ED Notes (Signed)
Discharge instructions discussed. Departs ED at this time in stable condition.   

## 2020-09-22 ENCOUNTER — Encounter: Payer: Self-pay | Admitting: Nurse Practitioner

## 2020-09-22 ENCOUNTER — Other Ambulatory Visit: Payer: Self-pay

## 2020-10-13 ENCOUNTER — Other Ambulatory Visit: Payer: Self-pay | Admitting: Emergency Medicine

## 2020-10-13 DIAGNOSIS — Z1231 Encounter for screening mammogram for malignant neoplasm of breast: Secondary | ICD-10-CM

## 2020-11-26 ENCOUNTER — Inpatient Hospital Stay: Admission: RE | Admit: 2020-11-26 | Payer: BC Managed Care – PPO | Source: Ambulatory Visit

## 2020-12-09 ENCOUNTER — Ambulatory Visit
Admission: RE | Admit: 2020-12-09 | Discharge: 2020-12-09 | Disposition: A | Discharge status: Home / Self Care | Payer: BC Managed Care – PPO | Source: Ambulatory Visit | Attending: Physician Assistant | Admitting: Physician Assistant

## 2020-12-09 ENCOUNTER — Other Ambulatory Visit: Payer: Self-pay

## 2020-12-09 DIAGNOSIS — Z1231 Encounter for screening mammogram for malignant neoplasm of breast: Secondary | ICD-10-CM

## 2021-12-11 ENCOUNTER — Telehealth (INDEPENDENT_AMBULATORY_CARE_PROVIDER_SITE_OTHER): Payer: Self-pay

## 2021-12-11 DIAGNOSIS — Z0289 Encounter for other administrative examinations: Secondary | ICD-10-CM

## 2021-12-14 NOTE — Telephone Encounter (Signed)
See my chart message

## 2021-12-22 ENCOUNTER — Ambulatory Visit (INDEPENDENT_AMBULATORY_CARE_PROVIDER_SITE_OTHER): Payer: BC Managed Care – PPO | Admitting: Family Medicine

## 2021-12-22 ENCOUNTER — Encounter (INDEPENDENT_AMBULATORY_CARE_PROVIDER_SITE_OTHER): Payer: Self-pay | Admitting: Family Medicine

## 2021-12-22 VITALS — BP 107/66 | HR 64 | Temp 98.0°F | Ht 64.0 in | Wt 186.0 lb

## 2021-12-22 DIAGNOSIS — R5383 Other fatigue: Secondary | ICD-10-CM

## 2021-12-22 DIAGNOSIS — R0602 Shortness of breath: Secondary | ICD-10-CM

## 2021-12-22 DIAGNOSIS — K5904 Chronic idiopathic constipation: Secondary | ICD-10-CM

## 2021-12-22 DIAGNOSIS — Z1331 Encounter for screening for depression: Secondary | ICD-10-CM | POA: Diagnosis not present

## 2021-12-22 DIAGNOSIS — R946 Abnormal results of thyroid function studies: Secondary | ICD-10-CM

## 2021-12-22 DIAGNOSIS — F39 Unspecified mood [affective] disorder: Secondary | ICD-10-CM

## 2021-12-22 DIAGNOSIS — E669 Obesity, unspecified: Secondary | ICD-10-CM

## 2021-12-22 DIAGNOSIS — Z9189 Other specified personal risk factors, not elsewhere classified: Secondary | ICD-10-CM

## 2021-12-22 DIAGNOSIS — E668 Other obesity: Secondary | ICD-10-CM

## 2021-12-22 DIAGNOSIS — Z6832 Body mass index (BMI) 32.0-32.9, adult: Secondary | ICD-10-CM

## 2021-12-23 LAB — COMPREHENSIVE METABOLIC PANEL
ALT: 17 IU/L (ref 0–32)
AST: 19 IU/L (ref 0–40)
Albumin/Globulin Ratio: 1.5 (ref 1.2–2.2)
Albumin: 4.4 g/dL (ref 3.8–4.8)
Alkaline Phosphatase: 82 IU/L (ref 44–121)
BUN/Creatinine Ratio: 14 (ref 9–23)
BUN: 12 mg/dL (ref 6–24)
Bilirubin Total: 0.6 mg/dL (ref 0.0–1.2)
CO2: 24 mmol/L (ref 20–29)
Calcium: 9.5 mg/dL (ref 8.7–10.2)
Chloride: 102 mmol/L (ref 96–106)
Creatinine, Ser: 0.87 mg/dL (ref 0.57–1.00)
Globulin, Total: 2.9 g/dL (ref 1.5–4.5)
Glucose: 83 mg/dL (ref 70–99)
Potassium: 4.3 mmol/L (ref 3.5–5.2)
Sodium: 139 mmol/L (ref 134–144)
Total Protein: 7.3 g/dL (ref 6.0–8.5)
eGFR: 81 mL/min/{1.73_m2} (ref >59)

## 2021-12-23 LAB — INSULIN, RANDOM: INSULIN: 3.6 u[IU]/mL (ref 2.6–24.9)

## 2021-12-23 LAB — CBC WITH DIFFERENTIAL/PLATELET
Basophils Absolute: 0 10*3/uL (ref 0.0–0.2)
Basos: 0 %
EOS (ABSOLUTE): 0.1 10*3/uL (ref 0.0–0.4)
Eos: 2 %
Hematocrit: 45.4 % (ref 34.0–46.6)
Hemoglobin: 15.1 g/dL (ref 11.1–15.9)
Immature Grans (Abs): 0 10*3/uL (ref 0.0–0.1)
Immature Granulocytes: 0 %
Lymphocytes Absolute: 2.2 10*3/uL (ref 0.7–3.1)
Lymphs: 41 %
MCH: 28.7 pg (ref 26.6–33.0)
MCHC: 33.3 g/dL (ref 31.5–35.7)
MCV: 86 fL (ref 79–97)
Monocytes Absolute: 0.4 10*3/uL (ref 0.1–0.9)
Monocytes: 8 %
Neutrophils Absolute: 2.6 10*3/uL (ref 1.4–7.0)
Neutrophils: 49 %
Platelets: 245 10*3/uL (ref 150–450)
RBC: 5.26 x10E6/uL (ref 3.77–5.28)
RDW: 13.5 % (ref 11.7–15.4)
WBC: 5.3 10*3/uL (ref 3.4–10.8)

## 2021-12-23 LAB — LIPID PANEL WITH LDL/HDL RATIO
Cholesterol, Total: 155 mg/dL (ref 100–199)
HDL: 43 mg/dL (ref >39)
LDL Chol Calc (NIH): 90 mg/dL (ref 0–99)
LDL/HDL Ratio: 2.1 ratio (ref 0.0–3.2)
Triglycerides: 123 mg/dL (ref 0–149)
VLDL Cholesterol Cal: 22 mg/dL (ref 5–40)

## 2021-12-23 LAB — VITAMIN D 25 HYDROXY (VIT D DEFICIENCY, FRACTURES): Vit D, 25-Hydroxy: 39.1 ng/mL (ref 30.0–100.0)

## 2021-12-23 LAB — HEMOGLOBIN A1C
Est. average glucose Bld gHb Est-mCnc: 123 mg/dL
Hgb A1c MFr Bld: 5.9 % — ABNORMAL HIGH (ref 4.8–5.6)

## 2021-12-23 LAB — TSH: TSH: 3.16 u[IU]/mL (ref 0.450–4.500)

## 2021-12-23 LAB — VITAMIN B12: Vitamin B-12: 1000 pg/mL (ref 232–1245)

## 2021-12-23 LAB — FOLATE: Folate: 13.6 ng/mL (ref >3.0)

## 2021-12-30 NOTE — Progress Notes (Signed)
? ? ? ? ?Chief Complaint:  ? ?OBESITY ?Denise Byrd (MR# [id number]) is a 51 y.o. female who presents for evaluation and treatment of obesity and related comorbidities. Current Body mass index is 31.93 kg/m?Denise Byrd has been struggling with her weight for many years and has been unsuccessful in either losing weight, maintaining weight loss, or reaching her healthy weight goal. ? 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 She lives with her husband. She craves carbs, and snacks on chips, grapes, and watermelon. She drinks a lot of calorie beverages.  ? ?Denise Byrd is currently in the action stage of change and ready to dedicate time achieving and maintaining a healthier weight. Denise Byrd is interested in becoming our patient and working on intensive lifestyle modifications including (but not limited to) diet and exercise for weight loss. ? ?Denise Byrd's habits were reviewed today and are as follows: Her family eats meals together, she thinks her family will eat healthier with her, her desired weight loss is 36 lbs, she has been heavy most of her life, she started gaining weight 10 years ago, her heaviest weight ever was 240 pounds, she has significant food cravings issues, she snacks frequently in the evenings, she is frequently drinking liquids with calories, she frequently makes poor food choices, she has problems with excessive hunger, she frequently eats larger portions than normal, and she struggles with emotional eating. ? ?Depression Screen ?Denise Byrd's Food and Mood (modified PHQ-9) score was 17. ? ? ?  12/22/2021  ?  9:58 AM  ?Depression screen PHQ 2/9  ?Decreased Interest 3  ?Down, Depressed, Hopeless 3  ?PHQ - 2 Score 6  ?Altered sleeping 1  ?Tired, decreased energy 3  ?Change in appetite 1  ?Feeling bad or failure about yourself  1  ?Trouble concentrating 3  ?Moving slowly or fidgety/restless 1  ?Suicidal thoughts 1  ?PHQ-9 Score 17  ?Difficult doing work/chores Very difficult   ? ?Subjective:  ? ?1. Other fatigue ?Denise Byrd admits to daytime somnolence and admits to waking up still tired. Patient has a history of symptoms of daytime fatigue, morning fatigue, and morning headache. [name] generally gets 5 or 6 hours of sleep per night, and states that she has generally restful sleep. Snoring is present. Apneic episodes are not present. Epworth Sleepiness Score is 11.  ? ?2. SOB (shortness of breath) on exertion ?Denise Byrd notes increasing shortness of breath with exercising and seems to be worsening over time with weight gain. She notes getting out of breath sooner with activity than she used to. This has not gotten worse recently. Denise Byrd denies shortness of breath at rest or orthopnea. ? ?3. Mood disorder ([place])- Emotional eating ?Denise Byrd has a history of anxiety and depression. Symptoms are currently controlled per the patient. She is not on medications.  ? ?4. Chronic idiopathic constipation ?Arhianna notes constipation is not really an issue currently, and patient started drinking 6-7 bottles of water per day and symptoms have resolved.  ? ?5. At risk for impaired metabolic function ?Denise Byrd is at increased risk for impaired metabolic function due to obesity. ? ?Assessment/Plan:  ? ?Orders Placed This Encounter  ?Procedures  ? Vitamin B12  ? CBC with Differential/Platelet  ? Comprehensive metabolic panel  ? Folate  ? Hemoglobin A1c  ? Insulin, random  ? TSH  ? VITAMIN D 25 Hydroxy (Vit-D Deficiency, Fractures)  ? Lipid Panel With LDL/HDL Ratio  ? EKG 12-Lead  ? ? ?There are no discontinued medications.  ? ?No orders of the  defined types were placed in this encounter. ?  ? ?1. Other fatigue ?Denise Byrd does feel that her weight is causing her energy to be lower than it should be. Fatigue may be related to obesity, depression or many other causes. Labs will be ordered, and in the meanwhile, Denise Byrd will focus on self care including making healthy food choices, increasing physical  activity and focusing on stress reduction. ? ?- EKG 12-Lead ?- Vitamin B12 ?- Folate ?- TSH ?- VITAMIN D 25 Hydroxy (Vit-D Deficiency, Fractures) ? ?2. SOB (shortness of breath) on exertion ?Denise Byrd does feel that she gets out of breath more easily that she used to when she exercises. Denise Byrd's shortness of breath appears to be obesity related and exercise induced. She has agreed to work on weight loss and gradually increase exercise to treat her exercise induced shortness of breath. Will continue to monitor closely. ? ?3. Mood disorder ([place])- Emotional eating ?I discussed with the patient Dr. Francee Piccolo role in counseling for emotional eating. She declines today, but she will think about it and let us know. We will monitor closely.  ? ?- Vitamin B12 ?- Folate ?- Lipid Panel With LDL/HDL Ratio ? ?4. Chronic idiopathic constipation ?Denise Byrd will continue proper hydration, start her prudent nutritional plan with increased fiber. Will continue to monitor.  ? ?- CBC with Differential/Platelet ?- Comprehensive metabolic panel ?- Hemoglobin A1c ?- Insulin, random ?- Lipid Panel With LDL/HDL Ratio ? ?5. Depression screening ?Denise Byrd had a positive depression screening. Depression is commonly associated with obesity and often results in emotional eating behaviors. We will monitor this closely and work on CBT to help improve the non-hunger eating patterns. Referral to Psychology may be required if no improvement is seen as she continues in our clinic. ? ?6. At risk for impaired metabolic function ?Denise Byrd was given approximately 15 minutes of impaired  metabolic function prevention counseling today. We discussed intensive lifestyle modifications today with an emphasis on specific nutrition and exercise instructions and strategies.  ? ?Repetitive spaced learning was employed today to elicit superior memory formation and behavioral change. ? ?7. Class 1 obesity with serious comorbidity and body mass index (BMI) of 32.0 to  32.9 in adult, unspecified obesity type ?Denise Byrd is currently in the action stage of change and her goal is to continue with weight loss efforts. I recommend Jilene begin the structured treatment plan as follows: ? ?She has agreed to the Category 1 Plan. ? ?Exercise goals: As is.  ? ?Behavioral modification strategies: meal planning and cooking strategies, avoiding temptations, and planning for success. ? ?She was informed of the importance of frequent follow-up visits to maximize her success with intensive lifestyle modifications for her multiple health conditions. She was informed we would discuss her lab results at her next visit unless there is a critical issue that needs to be addressed sooner. Shaquasha agreed to keep her next visit at the agreed upon time to discuss these results. ? ?Objective:  ? ?Blood pressure 107/66, pulse 64, temperature 98 ?F (36.7 ?C), height 5\' 4"  (1.626 m), weight 186 lb (84.4 kg), last menstrual period 11/11/2021, SpO2 99 %. Body mass index is 31.93 kg/m?. ? ?EKG: Normal sinus rhythm, rate 64 BPM. ? ?Indirect Calorimeter completed today shows a VO2 of 241 and a REE of 1656.  Her calculated basal metabolic rate is [id number] thus her basal metabolic rate is better than expected. ? ?General: Cooperative, alert, well developed, in no acute distress. ?HEENT: Conjunctivae and lids unremarkable. ?Cardiovascular: Regular rhythm.  ?Lungs:  Normal work of breathing. ?Neurologic: No focal deficits.  ? ?Lab Results  ?Component Value Date  ? CREATININE 0.87 12/22/2021  ? BUN 12 12/22/2021  ? NA 139 12/22/2021  ? K 4.3 12/22/2021  ? CL 102 12/22/2021  ? CO2 24 12/22/2021  ? ?Lab Results  ?Component Value Date  ? ALT 17 12/22/2021  ? AST 19 12/22/2021  ? ALKPHOS 82 12/22/2021  ? BILITOT 0.6 12/22/2021  ? ?Lab Results  ?Component Value Date  ? HGBA1C 5.9 (H) 12/22/2021  ? HGBA1C 5.4 12/06/2012  ? ?Lab Results  ?Component Value Date  ? INSULIN 3.6 12/22/2021  ? ?Lab Results  ?Component Value Date  ? TSH  3.160 12/22/2021  ? ?Lab Results  ?Component Value Date  ? CHOL 155 12/22/2021  ? HDL 43 12/22/2021  ? [place] 90 12/22/2021  ? TRIG 123 12/22/2021  ? CHOLHDL 3.7 10/27/2016  ? ?Lab Results  ?Component Val

## 2022-01-05 ENCOUNTER — Encounter (INDEPENDENT_AMBULATORY_CARE_PROVIDER_SITE_OTHER): Payer: Self-pay | Admitting: Family Medicine

## 2022-01-05 ENCOUNTER — Ambulatory Visit (INDEPENDENT_AMBULATORY_CARE_PROVIDER_SITE_OTHER): Payer: BC Managed Care – PPO | Admitting: Family Medicine

## 2022-01-05 ENCOUNTER — Ambulatory Visit (INDEPENDENT_AMBULATORY_CARE_PROVIDER_SITE_OTHER): Payer: Self-pay | Admitting: Bariatrics

## 2022-01-05 VITALS — BP 122/72 | HR 78 | Temp 97.9°F | Ht 64.0 in | Wt 183.0 lb

## 2022-01-05 DIAGNOSIS — K5904 Chronic idiopathic constipation: Secondary | ICD-10-CM | POA: Diagnosis not present

## 2022-01-05 DIAGNOSIS — R7303 Prediabetes: Secondary | ICD-10-CM

## 2022-01-05 DIAGNOSIS — Z6831 Body mass index (BMI) 31.0-31.9, adult: Secondary | ICD-10-CM

## 2022-01-05 DIAGNOSIS — E669 Obesity, unspecified: Secondary | ICD-10-CM

## 2022-01-05 DIAGNOSIS — F39 Unspecified mood [affective] disorder: Secondary | ICD-10-CM

## 2022-01-05 DIAGNOSIS — E559 Vitamin D deficiency, unspecified: Secondary | ICD-10-CM | POA: Diagnosis not present

## 2022-01-05 DIAGNOSIS — Z9189 Other specified personal risk factors, not elsewhere classified: Secondary | ICD-10-CM

## 2022-01-05 MED ORDER — VITAMIN D (ERGOCALCIFEROL) 1.25 MG (50000 UNIT) PO CAPS: 50000.0000 [IU] | ORAL_CAPSULE | ORAL | 0 refills | Status: DC

## 2022-01-20 NOTE — Progress Notes (Signed)
? ? ? ?Chief Complaint:  ? ?OBESITY ?Denise Byrd is here to discuss her progress with her obesity treatment plan along with follow-up of her obesity related diagnoses. Denise Byrd is on the Category 1 Plan and states she is following her eating plan approximately 75% of the time. Denise Byrd states she is walking 30 minutes 2 times per week. ? ?Today's visit was #: 2 ?Starting weight: 186 lbs ?Starting date: 12/22/2021 ?Today's weight: 183 lbs ?Today's date: 01/05/2022 ?Total lbs lost to date: 3 ?Total lbs lost since last in-office visit: 3 ? ?Interim History: Denise HarnessMigdalia Byrd is here today for her first follow-up office visit since starting the program with us.  All blood work/ lab tests that were recently ordered by myself or an outside provider were reviewed with patient today per their request.   Extended time was spent counseling her on all new disease processes that were discovered or preexisting ones that are affected by BMI.  she understands that many of these abnormalities will need to monitored regularly along with the current treatment plan of prudent dietary changes, in which we are making each and every office visit, to improve these health parameters. Denies hunger or cravings, no issues with meal plan. Snacks, celery sticks and some fruit occasionally. ? ?Subjective:  ? ?1. Prediabetes ?Patient never had prediabetes before, A1c 5.9. Patient prior craved carbs, ie: chips and caloric beverages. She declines the need for medications. I discussed labs with patient today. ? ?2. Vitamin D deficiency ?She is currently taking no vitamin D supplement. She has a history of Vit D abn, per patient but per chart low was at 20+ and now 39.1. She denies nausea, vomiting or muscle weakness. I discussed labs with the patient today. ? ?3. Chronic idiopathic constipation ?Side effects stable ** worse with meal plan, she is not drinking enough water.  ? ?4. Mood disorder (HCC)with emotional eating ?No need for Dr. Dewaine CongerBarker,  depression and anxiety are both very well controlled. I discussed labs with the patient today.  ? ?5. At risk for diabetes mellitus ?[name] is at higher than average risk for developing diabetes due to obesity.  ? ?Assessment/Plan:  ?No orders of the defined types were placed in this encounter. ? ? ?Medications Discontinued During This Encounter  ?Medication Reason  ? Vitamin D, Ergocalciferol, (DRISDOL) 1.25 MG (50000 UNIT) CAPS capsule   ?  ? ?Meds ordered this encounter  ?Medications  ? DISCONTD: Vitamin D, Ergocalciferol, (DRISDOL) 1.25 MG (50000 UNIT) CAPS capsule  ?  Sig: Take 1 capsule (50,000 Units total) by mouth every 7 (seven) days.  ?  Dispense:  4 capsule  ?  Refill:  0  ?  30 d supply;  ** OV for RF **   Do not send RF request  ? Vitamin D, Ergocalciferol, (DRISDOL) 1.25 MG (50000 UNIT) CAPS capsule  ?  Sig: Take 1 capsule (50,000 Units total) by mouth every 7 (seven) days.  ?  Dispense:  4 capsule  ?  Refill:  0  ?  30 d supply;  ** OV for RF **   Do not send RF request  ?  ? ?1. Prediabetes ?Denise Byrd's  A1c at 5.9. Handout given on Pre-diabetes and insulin resistance to patient. Will follow PNP and lose weight. Recheck in 3 months.  ? ?2. Vitamin D deficiency ?Start Ergocalciferol (see below). ? ?- I discussed the importance of vitamin D to the patient's health and well-being.  ?- I reviewed possible symptoms of low Vitamin D:  low energy, depressed mood, muscle aches, joint aches, osteoporosis etc. was reviewed with patient ?- low Vitamin D levels may be linked to an increased risk of cardiovascular events and even increased risk of cancers- such as colon and breast.  ?- ideal vitamin D levels reviewed with patient  ?- I recommend pt take a weekly prescription vit D - see script below   ?- Informed patient this may be a lifelong thing, and she was encouraged to continue to take the medicine until told otherwise.    ?- weight loss will likely improve availability of vitamin D, thus encouraged  [name] to continue with meal plan and their weight loss efforts to further improve this condition.  Thus, we will need to monitor levels regularly (every 3-4 mo on average) to keep levels within normal limits and prevent over supplementation. ?- pt's questions and concerns regarding this condition addressed. ? ?-Start  Vitamin D, Ergocalciferol, (DRISDOL) 1.25 MG (50000 UNIT) CAPS capsule; Take 1 capsule (50,000 Units total) by mouth every 7 (seven) days.  Dispense: 4 capsule; Refill: 0 ? ?3. Chronic idiopathic constipation ?CMP stable, drink 1/2 her weight in oz of H2O per day. Increase fiber and PNP. ? ?4. Mood disorder (HCC)with emotional eating ?Emotional eating well controlled.  Educated patient on eating healthier and losing weight will improve mood. Can't PNP. ? ?5. At risk for diabetes mellitus ?Denise Byrd was given approximately 24 minutes of diabetic education and counseling today. We discussed intensive lifestyle modifications today with an emphasis on weight loss as well as increasing exercise and decreasing simple carbohydrates in her diet. We also reviewed medication options with an emphasis on risk versus benefits of those discussed. ? ?Repetitive spaced learning was employed today to elicit superior memory formation and behavioral change. ? ?6. Obesity, current BMI 31.5 ?Denise Byrd is currently in the action stage of change. As such, her goal is to continue with weight loss efforts. She has agreed to the Category 1 Plan with lunch options.  ? ?Exercise goals: As is. ? ?Behavioral modification strategies: increasing lean protein intake, decreasing simple carbohydrates, increasing water intake, and meal planning and cooking strategies. ? ?[name] has agreed to follow-up with our clinic in 3 weeks. She was informed of the importance of frequent follow-up visits to maximize her success with intensive lifestyle modifications for her multiple health conditions.  ? ?Objective:  ? ?Blood pressure 122/72,  pulse 78, temperature 97.9 ?F (36.6 ?C), height 5\' 4"  (1.626 m), weight 183 lb (83 kg), SpO2 99 %. ?Body mass index is 31.41 kg/m?. ? ?General: Cooperative, alert, well developed, in no acute distress. ?HEENT: Conjunctivae and lids unremarkable. ?Cardiovascular: Regular rhythm.  ?Lungs: Normal work of breathing. ?Neurologic: No focal deficits.  ? ?Lab Results  ?Component Value Date  ? CREATININE 0.87 12/22/2021  ? BUN 12 12/22/2021  ? NA 139 12/22/2021  ? K 4.3 12/22/2021  ? CL 102 12/22/2021  ? CO2 24 12/22/2021  ? ?Lab Results  ?Component Value Date  ? ALT 17 12/22/2021  ? AST 19 12/22/2021  ? ALKPHOS 82 12/22/2021  ? BILITOT 0.6 12/22/2021  ? ?Lab Results  ?Component Value Date  ? HGBA1C 5.9 (H) 12/22/2021  ? HGBA1C 5.4 12/06/2012  ? ?Lab Results  ?Component Value Date  ? INSULIN 3.6 12/22/2021  ? ?Lab Results  ?Component Value Date  ? TSH 3.160 12/22/2021  ? ?Lab Results  ?Component Value Date  ? CHOL 155 12/22/2021  ? HDL 43 12/22/2021  ? LDLCALC 90 12/22/2021  ?  TRIG 123 12/22/2021  ? CHOLHDL 3.7 10/27/2016  ? ?Lab Results  ?Component Value Date  ? VD25OH 39.1 12/22/2021  ? VD25OH 42 06/19/2020  ? VD25OH 20 (L) 06/17/2020  ? ?Lab Results  ?Component Value Date  ? WBC 5.3 12/22/2021  ? HGB 15.1 12/22/2021  ? HCT 45.4 12/22/2021  ? MCV 86 12/22/2021  ? PLT 245 12/22/2021  ? ?No results found for: IRON, TIBC, FERRITIN ? ?Attestation Statements:  ? ?Reviewed by clinician on day of visit: allergies, medications, problem list, medical history, surgical history, family history, social history, and previous encounter notes. ? ? ?I, Brendell Tyus, RMA, am acting as transcriptionist for [employer], DO. ? ?I have reviewed the above documentation for accuracy and completeness, and I agree with the above. Carlye Grippe, D.O. ? ?The 21st Century Cures Act was signed into law in 2016 which includes the topic of electronic health records.  This provides immediate access to information in MyChart.  This includes  consultation notes, operative notes, office notes, lab results and pathology reports.  If you have any questions about what you read please let us know at your next visit so we can discuss your concerns and take correcti

## 2022-02-02 ENCOUNTER — Encounter (INDEPENDENT_AMBULATORY_CARE_PROVIDER_SITE_OTHER): Payer: Self-pay

## 2022-02-02 ENCOUNTER — Other Ambulatory Visit: Payer: Self-pay | Admitting: Physician Assistant

## 2022-02-02 ENCOUNTER — Ambulatory Visit (INDEPENDENT_AMBULATORY_CARE_PROVIDER_SITE_OTHER): Payer: BC Managed Care – PPO | Admitting: Family Medicine

## 2022-02-02 DIAGNOSIS — Z1231 Encounter for screening mammogram for malignant neoplasm of breast: Secondary | ICD-10-CM

## 2022-02-16 ENCOUNTER — Ambulatory Visit
Admission: RE | Admit: 2022-02-16 | Discharge: 2022-02-16 | Disposition: A | Discharge status: Home / Self Care | Payer: BC Managed Care – PPO | Source: Ambulatory Visit | Attending: Physician Assistant | Admitting: Physician Assistant

## 2022-02-16 DIAGNOSIS — Z1231 Encounter for screening mammogram for malignant neoplasm of breast: Secondary | ICD-10-CM

## 2022-02-18 ENCOUNTER — Other Ambulatory Visit: Payer: Self-pay | Admitting: Physician Assistant

## 2022-02-18 ENCOUNTER — Encounter (INDEPENDENT_AMBULATORY_CARE_PROVIDER_SITE_OTHER): Payer: Self-pay | Admitting: Family Medicine

## 2022-02-18 ENCOUNTER — Ambulatory Visit (INDEPENDENT_AMBULATORY_CARE_PROVIDER_SITE_OTHER): Payer: BC Managed Care – PPO | Admitting: Family Medicine

## 2022-02-18 VITALS — BP 98/65 | HR 86 | Temp 98.4°F | Ht 64.0 in | Wt 180.0 lb

## 2022-02-18 DIAGNOSIS — R7303 Prediabetes: Secondary | ICD-10-CM | POA: Diagnosis not present

## 2022-02-18 DIAGNOSIS — E669 Obesity, unspecified: Secondary | ICD-10-CM

## 2022-02-18 DIAGNOSIS — E559 Vitamin D deficiency, unspecified: Secondary | ICD-10-CM | POA: Diagnosis not present

## 2022-02-18 DIAGNOSIS — K5904 Chronic idiopathic constipation: Secondary | ICD-10-CM

## 2022-02-18 DIAGNOSIS — Z6830 Body mass index (BMI) 30.0-30.9, adult: Secondary | ICD-10-CM | POA: Diagnosis not present

## 2022-02-18 DIAGNOSIS — R928 Other abnormal and inconclusive findings on diagnostic imaging of breast: Secondary | ICD-10-CM

## 2022-02-18 MED ORDER — VITAMIN D (ERGOCALCIFEROL) 1.25 MG (50000 UNIT) PO CAPS: 50000.0000 [IU] | ORAL_CAPSULE | ORAL | 0 refills | Status: DC

## 2022-02-23 DIAGNOSIS — R7303 Prediabetes: Secondary | ICD-10-CM | POA: Insufficient documentation

## 2022-02-27 NOTE — Progress Notes (Unsigned)
Chief Complaint:   OBESITY Denise Byrd is here to discuss her progress with her obesity treatment plan along with follow-up of her obesity related diagnoses. Denise Byrd is on the Category 1 Plan with lunch options and states she is following her eating plan approximately 50% of the time. Denise Byrd states she is doing zumba 30 minutes 2 times per week.  Today's visit was #: 3 Starting weight: 186 lbs Starting date: 12/22/2021 Today's weight: 180 lbs Today's date: 02/18/2022 Total lbs lost to date: 6 lbs Total lbs lost since last in-office visit: 3 lbs  Interim History: Denise Byrd has lost 6 lbs in fast mass and gained 2.2 lbs in muscle.  She is doing great!  She reports her clothes are looser fitting, but she is still struggling with meal prepping.  Last office visit 01/05/2022, she was lost to follow up.   Subjective:   1. Prediabetes Denise Byrd denies hunger, sweets, or carbohydrate cravings.  Denies any issues or concerns. She is not on medications for this condition   2. Vitamin D deficiency She is currently taking prescription vitamin D 50,000 IU each week. She denies nausea, vomiting or muscle weakness.  Assessment/Plan:  No orders of the defined types were placed in this encounter.   Medications Discontinued During This Encounter  Medication Reason   traMADol (ULTRAM) 50 MG tablet Patient Preference   venlafaxine XR (EFFEXOR-XR) 75 MG 24 hr capsule Patient Preference   phentermine 30 MG capsule Patient Preference   Vitamin D, Ergocalciferol, (DRISDOL) 1.25 MG (50000 UNIT) CAPS capsule Reorder     Meds ordered this encounter  Medications   Vitamin D, Ergocalciferol, (DRISDOL) 1.25 MG (50000 UNIT) CAPS capsule    Sig: Take 1 capsule (50,000 Units total) by mouth every 7 (seven) days.    Dispense:  4 capsule    Refill:  0    30 d supply;  ** OV for RF **   Do not send RF request     1. Prediabetes Declines need for medications at this time.  Continue with prudent  nutritional plan and weight loss. Increase protein and decrease simple carbohydrates.   2. Vitamin D deficiency Low Vitamin D level contributes to fatigue and are associated with obesity, breast, and colon cancer. She agrees to continue to take prescription Vitamin D @50 ,000 IU every week and will follow-up for routine testing of Vitamin D, at least 2-3 times per year to avoid over-replacement.  Refill- Vitamin D, Ergocalciferol, (DRISDOL) 1.25 MG (50000 UNIT) CAPS capsule; Take 1 capsule (50,000 Units total) by mouth every 7 (seven) days.  Dispense: 4 capsule; Refill: 0  3. Obesity, Current BMI 30.9 Handouts given:  Recipe packet 1, reviewed with Denise Byrd.   Discussed meal planning ideas.  Denise Byrd is currently in the action stage of change. As such, her goal is to continue with weight loss efforts. She has agreed to the Category 2 Plan with lunch options.  Exercise goals:  Increase to 3-4 days per week at least for 30 minutes.    Behavioral modification strategies: meal planning and cooking strategies.  Denise Byrd has agreed to follow-up with our clinic in 3-4 weeks. She was informed of the importance of frequent follow-up visits to maximize her success with intensive lifestyle modifications for her multiple health conditions.   Objective:   Blood pressure 98/65, pulse 86, temperature 98.4 F (36.9 C), height 5\' 4"  (1.626 m), weight 180 lb (81.6 kg), SpO2 95 %. Body mass index is 30.9 kg/m.  General: Cooperative, alert,  well developed, in no acute distress. HEENT: Conjunctivae and lids unremarkable. Cardiovascular: Regular rhythm.  Lungs: Normal work of breathing. Neurologic: No focal deficits.   Lab Results  Component Value Date   CREATININE 0.87 12/22/2021   BUN 12 12/22/2021   NA 139 12/22/2021   K 4.3 12/22/2021   CL 102 12/22/2021   CO2 24 12/22/2021   Lab Results  Component Value Date   ALT 17 12/22/2021   AST 19 12/22/2021   ALKPHOS 82 12/22/2021   BILITOT 0.6  12/22/2021   Lab Results  Component Value Date   HGBA1C 5.9 (H) 12/22/2021   HGBA1C 5.4 12/06/2012   Lab Results  Component Value Date   INSULIN 3.6 12/22/2021   Lab Results  Component Value Date   TSH 3.160 12/22/2021   Lab Results  Component Value Date   CHOL 155 12/22/2021   HDL 43 12/22/2021   LDLCALC 90 12/22/2021   TRIG 123 12/22/2021   CHOLHDL 3.7 10/27/2016   Lab Results  Component Value Date   VD25OH 39.1 12/22/2021   VD25OH 42 06/19/2020   VD25OH 20 (L) 06/17/2020   Lab Results  Component Value Date   WBC 5.3 12/22/2021   HGB 15.1 12/22/2021   HCT 45.4 12/22/2021   MCV 86 12/22/2021   PLT 245 12/22/2021   No results found for: "IRON", "TIBC", "FERRITIN"  Attestation Statements:   Reviewed by clinician on day of visit: allergies, medications, problem list, medical history, surgical history, family history, social history, and previous encounter notes.  I, Malcolm Metro, RMA, am acting as Energy manager for [employer], DO.  I have reviewed the above documentation for accuracy and completeness, and I agree with the above. Carlye Grippe, D.O.  The 21st Century Cures Act was signed into law in 2016 which includes the topic of electronic health records.  This provides immediate access to information in MyChart.  This includes consultation notes, operative notes, office notes, lab results and pathology reports.  If you have any questions about what you read please let us know at your next visit so we can discuss your concerns and take corrective action if need be.  We are right here with you.

## 2022-03-03 ENCOUNTER — Ambulatory Visit
Admission: RE | Admit: 2022-03-03 | Discharge: 2022-03-03 | Disposition: A | Discharge status: Home / Self Care | Payer: BC Managed Care – PPO | Source: Ambulatory Visit | Attending: Physician Assistant | Admitting: Physician Assistant

## 2022-03-03 ENCOUNTER — Other Ambulatory Visit: Payer: Self-pay | Admitting: Physician Assistant

## 2022-03-03 DIAGNOSIS — N641 Fat necrosis of breast: Secondary | ICD-10-CM

## 2022-03-03 DIAGNOSIS — R928 Other abnormal and inconclusive findings on diagnostic imaging of breast: Secondary | ICD-10-CM

## 2022-03-11 ENCOUNTER — Ambulatory Visit (INDEPENDENT_AMBULATORY_CARE_PROVIDER_SITE_OTHER): Payer: BC Managed Care – PPO | Admitting: Family Medicine

## 2022-03-11 ENCOUNTER — Encounter (INDEPENDENT_AMBULATORY_CARE_PROVIDER_SITE_OTHER): Payer: Self-pay | Admitting: Family Medicine

## 2022-03-11 VITALS — BP 109/56 | HR 75 | Temp 98.3°F | Ht 64.0 in | Wt 178.0 lb

## 2022-03-11 DIAGNOSIS — Z6830 Body mass index (BMI) 30.0-30.9, adult: Secondary | ICD-10-CM

## 2022-03-11 DIAGNOSIS — E669 Obesity, unspecified: Secondary | ICD-10-CM | POA: Diagnosis not present

## 2022-03-11 DIAGNOSIS — R7303 Prediabetes: Secondary | ICD-10-CM | POA: Diagnosis not present

## 2022-03-11 DIAGNOSIS — E66811 Obesity, class 1: Secondary | ICD-10-CM

## 2022-03-11 DIAGNOSIS — E559 Vitamin D deficiency, unspecified: Secondary | ICD-10-CM

## 2022-03-11 MED ORDER — VITAMIN D (ERGOCALCIFEROL) 1.25 MG (50000 UNIT) PO CAPS: 50000.0000 [IU] | ORAL_CAPSULE | ORAL | 0 refills | Status: DC

## 2022-03-15 NOTE — Progress Notes (Signed)
Chief Complaint:   OBESITY Denise Byrd is here to discuss her progress with her obesity treatment plan along with follow-up of her obesity related diagnoses. Denise Byrd is on the Category 2 Plan with lunch options and states she is following her eating plan approximately 75% of the time. Denise Byrd states she is doing Zumba, walking stairs and on the treadmill  60 minutes 3 times per week.  Today's visit was #: 4 Starting weight: 186 lbs Starting date: 12/22/2021 Today's weight: 178 lbs Today's date: 03/11/2022 Total lbs lost to date: 8 lbs Total lbs lost since last in-office visit: 2 lbs  Interim History: Denise Byrd has been drinking more water and exercising more.  Occasionally deviating from the meal plan.  She is eating too many fruits which she is not calculating the amounts. Not skipping meals at all, she denies any hunger or cravings.   Subjective:   1. Prediabetes Denise Byrd has a diagnosis of prediabetes based on her elevated HgA1c and was informed this puts her at greater risk of developing diabetes. She continues to work on diet and exercise to decrease her risk of diabetes. She denies nausea or hypoglycemia. Denise Byrd denies any craving, no issues with sweets.   2. Vitamin D deficiency She is currently taking prescription vitamin D 50,000 IU each week. She denies nausea, vomiting or muscle weakness. Denise Byrd is tolerating medication(s) well without side effects.  Medication compliance is good as patient endorses taking it as prescribed.     Assessment/Plan:  No orders of the defined types were placed in this encounter.   Medications Discontinued During This Encounter  Medication Reason   Vitamin D, Ergocalciferol, (DRISDOL) 1.25 MG (50000 UNIT) CAPS capsule Reorder     Meds ordered this encounter  Medications   Vitamin D, Ergocalciferol, (DRISDOL) 1.25 MG (50000 UNIT) CAPS capsule    Sig: Take 1 capsule (50,000 Units total) by mouth every 7 (seven) days.     Dispense:  4 capsule    Refill:  0    30 d supply;  ** OV for RF **   Do not send RF request     1. Prediabetes Denise Byrd will continue to work on weight loss, exercise, and decreasing simple carbohydrates to help decrease the risk of diabetes.  Check A1c mid to end of July.   .   2. Vitamin D deficiency Low Vitamin D level contributes to fatigue and are associated with obesity, breast, and colon cancer. She agrees to continue to take prescription Vitamin D @50 ,000 IU every week and will follow-up for routine testing of Vitamin D, at least 2-3 times per year to avoid over-replacement. The patient denies additional concerns regarding this condition. Recheck Vitamin D mid to end of July.    Refill - Vitamin D, Ergocalciferol, (DRISDOL) 1.25 MG (50000 UNIT) CAPS capsule; Take 1 capsule (50,000 Units total) by mouth every 7 (seven) days.  Dispense: 4 capsule; Refill: 0  3. Obesity, Current BMI 30.6 Denise Byrd is currently in the action stage of change. As such, her goal is to continue with weight loss efforts. She has agreed to the Category 2 Plan with lunch options.   Exercise goals:  As is.   Behavioral modification strategies: increasing lean protein intake and increasing water intake.  Denise Byrd has agreed to follow-up with our clinic in 4 weeks per Denise Byrd's preference.  She was informed of the importance of frequent follow-up visits to maximize her success with intensive lifestyle modifications for her multiple health conditions.  Objective:   Blood pressure (!) 109/56, pulse 75, temperature 98.3 F (36.8 C), height 5\' 4"  (1.626 m), weight 178 lb (80.7 kg), SpO2 98 %. Body mass index is 30.55 kg/m.  General: Cooperative, alert, well developed, in no acute distress. HEENT: Conjunctivae and lids unremarkable. Cardiovascular: Regular rhythm.  Lungs: Normal work of breathing. Neurologic: No focal deficits.   Lab Results  Component Value Date   CREATININE 0.87 12/22/2021   BUN 12  12/22/2021   NA 139 12/22/2021   K 4.3 12/22/2021   CL 102 12/22/2021   CO2 24 12/22/2021   Lab Results  Component Value Date   ALT 17 12/22/2021   AST 19 12/22/2021   ALKPHOS 82 12/22/2021   BILITOT 0.6 12/22/2021   Lab Results  Component Value Date   HGBA1C 5.9 (H) 12/22/2021   HGBA1C 5.4 12/06/2012   Lab Results  Component Value Date   INSULIN 3.6 12/22/2021   Lab Results  Component Value Date   TSH 3.160 12/22/2021   Lab Results  Component Value Date   CHOL 155 12/22/2021   HDL 43 12/22/2021   LDLCALC 90 12/22/2021   TRIG 123 12/22/2021   CHOLHDL 3.7 10/27/2016   Lab Results  Component Value Date   VD25OH 39.1 12/22/2021   VD25OH 42 06/19/2020   VD25OH 20 (L) 06/17/2020   Lab Results  Component Value Date   WBC 5.3 12/22/2021   HGB 15.1 12/22/2021   HCT 45.4 12/22/2021   MCV 86 12/22/2021   PLT 245 12/22/2021   No results found for: "IRON", "TIBC", "FERRITIN"  Attestation Statements:   Reviewed by clinician on day of visit: allergies, medications, problem list, medical history, surgical history, family history, social history, and previous encounter notes.  I, 02/21/2022, RMA, am acting as Denise Byrd for Energy manager, DO.   I have reviewed the above documentation for accuracy and completeness, and I agree with the above. [employer], D.O.  The 21st Century Cures Act was signed into law in 2016 which includes the topic of electronic health records.  This provides immediate access to information in MyChart.  This includes consultation notes, operative notes, office notes, lab results and pathology reports.  If you have any questions about what you read please let 2017 know at your next visit so we can discuss your concerns and take corrective action if need be.  We are right here with you.

## 2022-04-08 ENCOUNTER — Ambulatory Visit (INDEPENDENT_AMBULATORY_CARE_PROVIDER_SITE_OTHER): Payer: BC Managed Care – PPO | Admitting: Family Medicine

## 2022-04-21 ENCOUNTER — Encounter (INDEPENDENT_AMBULATORY_CARE_PROVIDER_SITE_OTHER): Payer: Self-pay

## 2022-05-11 ENCOUNTER — Ambulatory Visit (INDEPENDENT_AMBULATORY_CARE_PROVIDER_SITE_OTHER): Payer: BC Managed Care – PPO | Admitting: Radiology

## 2022-05-11 VITALS — BP 104/62

## 2022-05-11 DIAGNOSIS — R300 Dysuria: Secondary | ICD-10-CM | POA: Diagnosis not present

## 2022-05-11 DIAGNOSIS — N949 Unspecified condition associated with female genital organs and menstrual cycle: Secondary | ICD-10-CM

## 2022-05-11 LAB — WET PREP FOR TRICH, YEAST, CLUE

## 2022-05-11 MED ORDER — ESTRADIOL 0.1 MG/GM VA CREA: 1.0000 g | TOPICAL_CREAM | VAGINAL | 12 refills | Status: DC

## 2022-05-11 MED ORDER — SULFAMETHOXAZOLE-TRIMETHOPRIM 800-160 MG PO TABS: 1.0000 | ORAL_TABLET | Freq: Two times a day (BID) | ORAL | 0 refills | Status: DC

## 2022-05-11 NOTE — Progress Notes (Signed)
      Subjective: Denise Byrd is a 51 y.o. female who complains of dysuria, frequency, urgency x's 2 days along with vaginal burning and pain with intercourse.   Review of Systems  All other systems reviewed and are negative.   Past Medical History:  Diagnosis Date   Anxiety    Back pain    Constipation    Depression    Thyroid condition    Vitamin D deficiency       Objective:  Today's Vitals   05/11/22 1524  BP: 104/62   There is no height or weight on file to calculate BMI.   -General: no acute distress -Vulva: without lesions or discharge -Vagina: atrophic, scant discharge present, aptima swab and wet prep obtained -Cervix: no lesion or discharge, no CMT -Perineum: no lesions -Uterus: Mobile, non tender -Adnexa: no masses or tenderness - No CVAT  Urine dipstick shows positive for RBC's.  Micro exam: 3-10 RBC's per HPF.  Microscopic wet-mount exam shows negative for pathogens, normal epithelial cells.   Chaperone offered and declined.  Assessment:/Plan:  1. Dysuria Bactrim DS po BID  - Urinalysis,Complete w/RFL Culture  2. Vaginal burning Vaginal atrophy Rx sent for estrace cream - WET PREP FOR TRICH, YEAST, CLUE   Will contact patient with results of testing completed today. Avoid intercourse until symptoms are resolved. Safe sex encouraged. Avoid the use of soaps or perfumed products in the peri area. Avoid tub baths and sitting in sweaty or wet clothing for prolonged periods of time.

## 2022-05-13 LAB — URINALYSIS, COMPLETE W/RFL CULTURE
Bacteria, UA: NONE SEEN /HPF
Bilirubin Urine: NEGATIVE
Glucose, UA: NEGATIVE
Hyaline Cast: NONE SEEN /LPF
Ketones, ur: NEGATIVE
Leukocyte Esterase: NEGATIVE
Nitrites, Initial: NEGATIVE
Protein, ur: NEGATIVE
Specific Gravity, Urine: 1.015 (ref 1.001–1.035)
WBC, UA: NONE SEEN /HPF (ref 0–5)
pH: 7.5 (ref 5.0–8.0)

## 2022-05-13 LAB — URINE CULTURE
MICRO NUMBER:: 13846585
Result:: NO GROWTH
SPECIMEN QUALITY:: ADEQUATE

## 2022-05-13 LAB — CULTURE INDICATED

## 2022-06-08 ENCOUNTER — Other Ambulatory Visit: Payer: Self-pay | Admitting: Physician Assistant

## 2022-06-08 ENCOUNTER — Ambulatory Visit
Admission: RE | Admit: 2022-06-08 | Discharge: 2022-06-08 | Disposition: A | Discharge status: Home / Self Care | Payer: BC Managed Care – PPO | Source: Ambulatory Visit | Attending: Physician Assistant | Admitting: Physician Assistant

## 2022-06-08 DIAGNOSIS — N641 Fat necrosis of breast: Secondary | ICD-10-CM

## 2022-07-08 ENCOUNTER — Encounter: Payer: Self-pay | Admitting: Radiology

## 2022-07-08 ENCOUNTER — Ambulatory Visit (INDEPENDENT_AMBULATORY_CARE_PROVIDER_SITE_OTHER): Payer: BC Managed Care – PPO | Admitting: Radiology

## 2022-07-08 ENCOUNTER — Other Ambulatory Visit (HOSPITAL_COMMUNITY)
Admission: RE | Admit: 2022-07-08 | Discharge: 2022-07-08 | Disposition: A | Discharge status: Home / Self Care | Payer: BC Managed Care – PPO | Source: Ambulatory Visit | Attending: Radiology | Admitting: Radiology

## 2022-07-08 VITALS — BP 116/68 | Ht 64.0 in | Wt 171.0 lb

## 2022-07-08 DIAGNOSIS — Z789 Other specified health status: Secondary | ICD-10-CM

## 2022-07-08 DIAGNOSIS — Z01419 Encounter for gynecological examination (general) (routine) without abnormal findings: Secondary | ICD-10-CM | POA: Insufficient documentation

## 2022-07-08 NOTE — Progress Notes (Signed)
   Denise Byrd 1971/04/10 [id number]   History:  51 y.o. G3P3 presents for annual exam. Husband would like to have a baby. She has had a BTL. Would like to know if she is fertile still or not.  Gynecologic History Patient's last menstrual period was 05/06/2022 (approximate). Period Duration (Days): 3 Period Pattern: (!) Irregular Menstrual Flow: Moderate Menstrual Control: Maxi pad Dysmenorrhea: (!) Moderate (1st two days) Dysmenorrhea Symptoms: Cramping Contraception/Family planning: tubal ligation Sexually active: yes Last Pap: 2018. Results were: normal Mammogram scheduled next month  Obstetric History OB History  Gravida Para Term Preterm AB Living  3 3 3  0 0 3  SAB IAB Ectopic Multiple Live Births  0 0 0   3    # Outcome Date GA Lbr Len/2nd Weight Sex Delivery Anes PTL Lv  3 Term     M Vag-Spont  N LIV  2 Term     M Vag-Spont  N LIV  1 Term     F Vag-Spont  N LIV     The following portions of the patient's history were reviewed and updated as appropriate: allergies, current medications, past family history, past medical history, past social history, past surgical history, and problem list.  Review of Systems Pertinent items noted in HPI and remainder of comprehensive ROS otherwise negative.   Past medical history, past surgical history, family history and social history were all reviewed and documented in the EPIC chart.   Exam:  Vitals:   07/08/22 1417  BP: 116/68  Weight: 171 lb (77.6 kg)  Height: 5\' 4"  (1.626 m)   Body mass index is 29.35 kg/m.  General appearance:  Normal Thyroid:  Symmetrical, normal in size, without palpable masses or nodularity. Respiratory  Auscultation:  Clear without wheezing or rhonchi Cardiovascular  Auscultation:  Regular rate, without rubs, murmurs or gallops  Edema/varicosities:  Not grossly evident Abdominal  Soft,nontender, without masses, guarding or rebound.  Liver/spleen:  No organomegaly noted  Hernia:  None  appreciated  Skin  Inspection:  Grossly normal Breasts: Examined lying and sitting.   Right: Without masses, retractions, nipple discharge or axillary adenopathy.   Left: Without masses, retractions, nipple discharge or axillary adenopathy. Genitourinary   Inguinal/mons:  Normal without inguinal adenopathy  External genitalia:  Normal appearing vulva with no masses, tenderness, or lesions  BUS/Urethra/Skene's glands:  Normal without masses or exudate  Vagina:  Atrophic, normal color and discharge, no lesions  Cervix:  Normal appearing without discharge or lesions  Uterus:  Normal in size, shape and contour.  Mobile, nontender  Adnexa/parametria:     Rt: Normal in size, without masses or tenderness.   Lt: Normal in size, without masses or tenderness.  Anus and perineum: Normal   Patient informed chaperone available to be present for breast and pelvic exam. Patient has requested no chaperone to be present. Patient has been advised what will be completed during breast and pelvic exam.   Assessment/Plan:   1. Well woman exam with routine gynecological exam  - Cytology - PAP( [facility])  2. Attempting to conceive Will check [facility] before referring to infertility - Anti-Mullerian Hormone [facility]), Female     Discussed SBE, colonoscopy and DEXA screening as directed/appropriate. Recommend 142mins of exercise weekly, including weight bearing exercise. Encouraged the use of seatbelts and sunscreen. Return in 1 year for annual or as needed.   Rubbie Battiest B WHNP-BC 2:34 PM 07/08/2022

## 2022-07-12 LAB — ANTI-MULLERIAN HORMONE (AMH), FEMALE: Anti-Mullerian Hormones(AMH), Female: 0.01 ng/mL

## 2022-07-13 LAB — CYTOLOGY - PAP
Comment: NEGATIVE
Diagnosis: NEGATIVE
High risk HPV: NEGATIVE

## 2022-09-08 ENCOUNTER — Ambulatory Visit
Admission: RE | Admit: 2022-09-08 | Discharge: 2022-09-08 | Disposition: A | Discharge status: Home / Self Care | Payer: BC Managed Care – PPO | Source: Ambulatory Visit | Attending: Physician Assistant | Admitting: Physician Assistant

## 2022-09-08 DIAGNOSIS — N641 Fat necrosis of breast: Secondary | ICD-10-CM

## 2022-09-17 ENCOUNTER — Other Ambulatory Visit: Payer: Self-pay | Admitting: Physician Assistant

## 2022-09-17 DIAGNOSIS — N641 Fat necrosis of breast: Secondary | ICD-10-CM

## 2023-01-18 NOTE — Progress Notes (Unsigned)
GYNECOLOGY  VISIT   HPI: 52 y.o.   Married  other or two or more races  female   [phone] with Patient's last menstrual period was 01/13/2023.   here for   UTI. Burning during intercourse and urination. Little odor. No discharge. Pt did not have period for a year until last week and then had burning on Sunday.  Dysuria stated last week, and has been getting worse.  No blood in her urine.  Some lower abdominal pain. No back pain or nausea.  No fevers.   Pain with intercourse just started.  No new partner. No lubricant used.   Decreased libido.   Her LMP was 01/13/23.  Her prior LMP was in May 2023.  Some hot flashes.   GYNECOLOGIC HISTORY: Patient's last menstrual period was 01/13/2023. Contraception:  BTL Menopausal hormone therapy:  n/a Last mammogram:  09/08/22 Breast Density Cat B, BI-RADS CAT 3 probably benign Last pap smear:   07/08/22 neg: HR HPV neg, 01/01/14 neg        OB History     Gravida  3   Para  3   Term  3   Preterm  0   AB  0   Living  3      SAB  0   IAB  0   Ectopic  0   Multiple      Live Births  3              Patient Active Problem List   Diagnosis Date Noted   Prediabetes 02/23/2022   Thyroid function test abnormal 12/22/2021   Chronic idiopathic constipation 12/22/2021   Vitamin D deficiency 11/03/2016   Episode of recurrent major depressive disorder (HCC) 10/26/2016   Depression 07/24/2013   Anxiety 07/24/2013   Constipation 06/07/2013   Weight gain 12/06/2012    Past Medical History:  Diagnosis Date   Anxiety    Back pain    Constipation    Depression    Thyroid condition    Vitamin D deficiency     Past Surgical History:  Procedure Laterality Date   APPENDECTOMY     AUGMENTATION MAMMAPLASTY  08/31/2011   SALINE, replaced 08/2021   COMBINED AUGMENTATION MAMMAPLASTY AND ABDOMINOPLASTY  08/31/2011   LIPOSUCTION TRUNK  08/31/2011   TUBAL LIGATION      Current Outpatient Medications  Medication Sig  Dispense Refill   meloxicam (MOBIC) 7.5 MG tablet Take by mouth as directed.     ondansetron (ZOFRAN ODT) 4 MG disintegrating tablet Take 1 tablet (4 mg total) by mouth every 8 (eight) hours as needed for nausea or vomiting. 20 tablet 0   temazepam (RESTORIL) 30 MG capsule Take 30 mg by mouth at bedtime as needed for sleep. (Patient not taking: Reported on 01/19/2023)     ziprasidone (GEODON) 60 MG capsule Take 60 mg by mouth 2 (two) times daily with a meal. (Patient not taking: Reported on 01/19/2023)     No current facility-administered medications for this visit.     ALLERGIES: Patient has no known allergies.  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  Tobacco Use   Smoking status: Never    Passive exposure: Never   Smokeless tobacco: Never  Vaping Use   Vaping Use: Never used  Substance and Sexual Activity   Alcohol use: Never   Drug use: Never   Sexual activity: Yes    Partners: Male    Birth control/protection: Surgical, None    Comment: tubal ligation  Other Topics Concern   Not on file  Social History Narrative   ** Merged History Encounter **       Lives with daughter Caffeine- coffee, 2 cups daily   Social Determinants of Health   Financial Resource Strain: Not on file  Food Insecurity: Not on file  Transportation Needs: Not on file  Physical Activity: Not on file  Stress: Not on file  Social Connections: Not on file  Intimate Partner Violence: Not on file    Review of Systems  Genitourinary:  Positive for dysuria, frequency and pelvic pain.    PHYSICAL EXAMINATION:    BP 116/70 (BP  Location: Right Arm, Patient Position: Sitting, Cuff Size: Normal)   Wt 166 lb (75.3 kg)   LMP 01/13/2023   BMI 28.49 kg/m     General appearance: alert, cooperative and appears stated age   Pelvic: External genitalia:  no lesions              Urethra:  normal appearing urethra with no masses, tenderness or lesions              Bartholins and Skenes: normal                 Vagina: normal appearing vagina with normal color and discharge, no lesions              Cervix: no lesions.   Some mild cervical motion tenderness.                 Bimanual Exam:  Uterus:  normal size, contour, position, consistency, mobility, non-tender              Adnexa: no mass, fullness, tenderness          Chaperone was present for exam:  Warren Lacy, CMA  ASSESSMENT  Pelvic pain.  Dysuria.  Urinary frequency.  Postmenopausal bleeding.   PLAN  Urinalysis:  sg 1.020, pH 5.5.  0 - 5 WBC, 0 - 2 RBC, 6 - 10 squams. Few bacteria.  UC sent.  Start bactrim DS po bid x 3 days.  Wet prep:  neg yeast, neg clue cells, negative trichomonas.  Testing for GC/CT.  FSH and estradiol.  If labs confirm menopause, patient will need to return for pelvic US and possible EMB. Consider vaginal estrogen treatment.    28 min  total time was spent for this patient encounter, including preparation, face-to-face counseling with the patient, coordination of care, and documentation of the encounter.

## 2023-01-19 ENCOUNTER — Ambulatory Visit: Payer: BC Managed Care – PPO | Admitting: Obstetrics and Gynecology

## 2023-01-19 ENCOUNTER — Other Ambulatory Visit (HOSPITAL_COMMUNITY)
Admission: RE | Admit: 2023-01-19 | Discharge: 2023-01-19 | Disposition: A | Discharge status: Home / Self Care | Payer: BC Managed Care – PPO | Source: Ambulatory Visit | Attending: Obstetrics and Gynecology | Admitting: Obstetrics and Gynecology

## 2023-01-19 ENCOUNTER — Encounter: Payer: Self-pay | Admitting: Obstetrics and Gynecology

## 2023-01-19 VITALS — BP 116/70 | Wt 166.0 lb

## 2023-01-19 DIAGNOSIS — R300 Dysuria: Secondary | ICD-10-CM | POA: Diagnosis not present

## 2023-01-19 DIAGNOSIS — N950 Postmenopausal bleeding: Secondary | ICD-10-CM

## 2023-01-19 DIAGNOSIS — R350 Frequency of micturition: Secondary | ICD-10-CM | POA: Diagnosis not present

## 2023-01-19 DIAGNOSIS — R102 Pelvic and perineal pain: Secondary | ICD-10-CM | POA: Insufficient documentation

## 2023-01-19 LAB — WET PREP FOR TRICH, YEAST, CLUE

## 2023-01-19 MED ORDER — SULFAMETHOXAZOLE-TRIMETHOPRIM 800-160 MG PO TABS: 1.0000 | ORAL_TABLET | Freq: Two times a day (BID) | ORAL | 0 refills | Status: DC

## 2023-01-20 LAB — CULTURE INDICATED

## 2023-01-20 LAB — URINALYSIS, COMPLETE W/RFL CULTURE
Bilirubin Urine: NEGATIVE
Casts: NONE SEEN /LPF
Crystals: NONE SEEN /HPF
Glucose, UA: NEGATIVE
Hyaline Cast: NONE SEEN /LPF
Ketones, ur: NEGATIVE
Leukocyte Esterase: NEGATIVE
Nitrites, Initial: NEGATIVE
Protein, ur: NEGATIVE
Specific Gravity, Urine: 1.02 (ref 1.001–1.035)
Yeast: NONE SEEN /HPF
pH: 5.5 (ref 5.0–8.0)

## 2023-01-20 LAB — URINE CULTURE
MICRO NUMBER:: 14929686
Result:: NO GROWTH
SPECIMEN QUALITY:: ADEQUATE

## 2023-01-20 LAB — FOLLICLE STIMULATING HORMONE: FSH: 43.8 m[IU]/mL

## 2023-01-20 LAB — CERVICOVAGINAL ANCILLARY ONLY
Chlamydia: NEGATIVE
Comment: NEGATIVE
Comment: NORMAL
Neisseria Gonorrhea: NEGATIVE

## 2023-01-20 LAB — ESTRADIOL: Estradiol: <15 pg/mL

## 2023-01-21 ENCOUNTER — Other Ambulatory Visit: Payer: Self-pay | Admitting: Obstetrics and Gynecology

## 2023-01-21 DIAGNOSIS — N950 Postmenopausal bleeding: Secondary | ICD-10-CM

## 2023-03-10 ENCOUNTER — Encounter: Payer: Self-pay | Admitting: Obstetrics & Gynecology

## 2023-03-10 ENCOUNTER — Ambulatory Visit (INDEPENDENT_AMBULATORY_CARE_PROVIDER_SITE_OTHER): Payer: BC Managed Care – PPO | Admitting: Obstetrics & Gynecology

## 2023-03-10 VITALS — BP 114/70 | HR 68

## 2023-03-10 DIAGNOSIS — N950 Postmenopausal bleeding: Secondary | ICD-10-CM | POA: Diagnosis not present

## 2023-03-10 DIAGNOSIS — N812 Incomplete uterovaginal prolapse: Secondary | ICD-10-CM | POA: Diagnosis not present

## 2023-03-10 NOTE — Progress Notes (Signed)
    Denise Byrd Oct 10, 1970 [id number]        52 y.o.  G3P3L3 Married. S/P TL.  RP: Vaginal bulging this morning  HPI: Pt c/o of possible prolapse with bulging in the vagina while she was pushing this morning for a BM. No UTI symptoms.  No SUI.  Had PMB 01/13/23 after a year without menses.  FSH 43.8 and Estradiol <15 on 01/19/23.  Will schedule Pelvic US.   OB History  Gravida Para Term Preterm AB Living  3 3 3  0 0 3  SAB IAB Ectopic Multiple Live Births  0 0 0   3    # Outcome Date GA Lbr Len/2nd Weight Sex Delivery Anes PTL Lv  3 Term     M Vag-Spont  N LIV  2 Term     M Vag-Spont  N LIV  1 Term     F Vag-Spont  N LIV    Past medical history,surgical history, problem list, medications, allergies, family history and social history were all reviewed and documented in the EPIC chart.   Directed ROS with pertinent positives and negatives documented in the history of present illness/assessment and plan.  Exam:  Vitals:   03/10/23 1539  BP: 114/70  Pulse: 68  SpO2: 99%   General appearance:  Normal  Gynecologic exam: Vulva normal.  Bimanual exam Uterus AV, normal volume, mobile, NT.  No adnexal mass, NT. Standing position with Valsalva:  Cystocele grade 2/4.  Rectocele grade 1/3.  Uterine prolapse 1/3.   Assessment/Plan:  52 y.o. G3P3003   1. Cystocele with first degree uterine prolapse Pt c/o of possible prolapse with bulging in the vagina while she was pushing this morning for a BM. No UTI symptoms.  No SUI.   On Gyn exam standing with Valsalva: Cystocele grade 2/4.  Rectocele grade 1/3.  Uterine prolapse 1/3.  Findings reviewed with patient.  Counseling done on relaxation of the pelvic floor and recommendations discussed to avoid progression.  Avoid pelvic floor pressure, empty bladder regularly before it overfills, treat constipation and cough and avoid pushing down or out when lifting.  Kegel exercises instructed and recommended.  Declines PT at this time.  2.  Postmenopausal bleeding PHad PMB 01/13/23 after a year without menses.  FSH 43.8 and Estradiol <15 on 01/19/23.  Pelvic US scheduled 04/07/23.  Other orders - phentermine (ADIPEX-P) 37.5 MG tablet; Take by mouth. (Patient not taking: Reported on 03/10/2023)   Genia Del MD, 3:57 PM 03/10/2023

## 2023-03-11 ENCOUNTER — Encounter: Payer: Self-pay | Admitting: Obstetrics & Gynecology

## 2023-04-06 NOTE — Progress Notes (Deleted)
GYNECOLOGY  VISIT   HPI: 52 y.o.   Married  other  female   [phone] with Patient's last menstrual period was 01/13/2023.   here for   U/S consult and endo bx GYNECOLOGIC HISTORY: Patient's last menstrual period was 01/13/2023. Contraception:  PMP Menopausal hormone therapy:  n/a Last mammogram:  09/08/22 Breast Density Cat B, BI-RADS CAT 3 probably benign Last pap smear:   07/08/22 neg: HR HPV neg, 01/01/14 neg        OB History     Gravida  3   Para  3   Term  3   Preterm  0   AB  0   Living  3      SAB  0   IAB  0   Ectopic  0   Multiple      Live Births  3              Patient Active Problem List   Diagnosis Date Noted   Prediabetes 02/23/2022   Thyroid function test abnormal 12/22/2021   Chronic idiopathic constipation 12/22/2021   Vitamin D deficiency 11/03/2016   Episode of recurrent major depressive disorder (HCC) 10/26/2016   Depression 07/24/2013   Anxiety 07/24/2013   Constipation 06/07/2013   Weight gain 12/06/2012    Past Medical History:  Diagnosis Date   Anxiety    Back pain    Constipation    Depression    Thyroid condition    Vitamin D deficiency     Past Surgical History:  Procedure Laterality Date   APPENDECTOMY     AUGMENTATION MAMMAPLASTY  08/31/2011   SALINE, replaced 08/2021   COMBINED AUGMENTATION MAMMAPLASTY AND ABDOMINOPLASTY  08/31/2011   LIPOSUCTION TRUNK  08/31/2011   TUBAL LIGATION      Current Outpatient Medications  Medication Sig Dispense Refill   ondansetron (ZOFRAN ODT) 4 MG disintegrating tablet Take 1 tablet (4 mg total) by mouth every 8 (eight) hours as needed for nausea or vomiting. 20 tablet 0   phentermine (ADIPEX-P) 37.5 MG tablet Take by mouth. (Patient not taking: Reported on 03/10/2023)     No current facility-administered medications for this visit.     ALLERGIES: Patient has no known allergies.  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  Tobacco Use   Smoking status: Never    Passive exposure: Never   Smokeless tobacco: Never  Vaping Use   Vaping status: Never Used  Substance and Sexual Activity   Alcohol use: Never   Drug use: Never   Sexual activity: Yes    Partners: Male    Birth control/protection: Surgical    Comment: tubal ligation  Other Topics Concern   Not on file  Social History Narrative   ** Merged History Encounter **       Lives with daughter Caffeine- coffee, 2 cups daily   Social Determinants of Health   Financial Resource Strain: Not on file  Food Insecurity: Unknown (08/12/2022)   [facility]   Hunger  Vital Sign    Worried About Programme researcher, broadcasting/film/video in the Last Year: Patient declined    Ran Out of Food in the Last Year: Not on file  Transportation Needs: Not on file (03/08/2023)  Physical Activity: Not on file  Stress: Not on file  Social Connections: Unknown (01/11/2022)   [facility]   Social Network    Social Network: Not on file  Intimate Partner Violence: Unknown (12/29/2021)   Received from [facility]   HITS    Physically Hurt: Not on file    Insult or Talk Down To: Not on file    Threaten Physical Harm: Not on file    Scream or Curse: Not on file    Review of Systems  PHYSICAL EXAMINATION:    LMP 01/13/2023     General appearance: alert, cooperative and appears stated age Head: Normocephalic, without obvious abnormality, atraumatic Neck: no adenopathy, supple, symmetrical, trachea midline and thyroid normal to inspection and palpation Lungs: clear to auscultation bilaterally Breasts: normal  appearance, no masses or tenderness, No nipple retraction or dimpling, No nipple discharge or bleeding, No axillary or supraclavicular adenopathy Heart: regular rate and rhythm Abdomen: soft, non-tender, no masses,  no organomegaly Extremities: extremities normal, atraumatic, no cyanosis or edema Skin: Skin color, texture, turgor normal. No rashes or lesions Lymph nodes: Cervical, supraclavicular, and axillary nodes normal. No abnormal inguinal nodes palpated Neurologic: Grossly normal  Pelvic: External genitalia:  no lesions              Urethra:  normal appearing urethra with no masses, tenderness or lesions              Bartholins and Skenes: normal                 Vagina: normal appearing vagina with normal color and discharge, no lesions              Cervix: no lesions                Bimanual Exam:  Uterus:  normal size, contour, position, consistency, mobility, non-tender              Adnexa: no mass, fullness, tenderness              Rectal exam: {yes no:[id number]}.  Confirms.              Anus:  normal sphincter tone, no lesions  Chaperone was present for exam:  ***  ASSESSMENT     PLAN     An After Visit Summary was printed and given to the patient.  ______ minutes face to face time of which over 50% was spent in counseling.

## 2023-04-07 ENCOUNTER — Other Ambulatory Visit: Payer: BC Managed Care – PPO | Admitting: Obstetrics and Gynecology

## 2023-04-07 ENCOUNTER — Telehealth: Payer: Self-pay

## 2023-04-07 ENCOUNTER — Other Ambulatory Visit: Payer: BC Managed Care – PPO

## 2023-04-07 DIAGNOSIS — N950 Postmenopausal bleeding: Secondary | ICD-10-CM

## 2023-04-07 NOTE — Telephone Encounter (Signed)
-----   Message from CMA Alora B sent at 04/07/2023 10:58 AM EDT ----- Patient was late for PUS will need to be called and resch with order for a different facility thanks!

## 2023-04-07 NOTE — Telephone Encounter (Signed)
Order for in-office [name] deleted/replaced with order for [name] to be done at outpatient imaging center.   Pt notified and informed of [facility] centralized scheduling desk # to call and schedule appt.   Pt voiced understanding. Will route to provider for review but will leave open to document appt date/time.

## 2023-04-12 NOTE — Telephone Encounter (Signed)
Thank you for the update!

## 2023-05-02 NOTE — Telephone Encounter (Signed)
[name] not yet scheduled.   Spoke w/ pt and she stated that she has been super busy with working two jobs and forgotten about making the appointment for the [name]. Pt advised to call at her earliest convenience and # provided to her via mychart msg. Pt voiced understanding and appreciation for call.

## 2023-06-16 NOTE — Telephone Encounter (Signed)
Tried to call pt, received partial VM, then line dropped.  Second call, got pt on the line and she requested if we could give her an additional cb tomorrow (06/17/2023) around 10am.

## 2023-06-17 NOTE — Telephone Encounter (Signed)
LVMTCB

## 2023-06-27 NOTE — Telephone Encounter (Signed)
Patient needs formal letter recommending proceeding with pelvic ultrasound and possible endometrial biopsy for postmenopausal bleeding.   Please inquire if there are barriers to her receiving this care.

## 2023-06-27 NOTE — Telephone Encounter (Signed)
Pt not yet scheduled for recommended [name] for PMB.   Please advise on follow up recommendations.

## 2023-06-28 NOTE — Telephone Encounter (Signed)
Letter pended. Routing to Dr. Edward Jolly to view.

## 2023-06-29 NOTE — Telephone Encounter (Signed)
Letter is ok.  I will need copy to sign.

## 2023-06-29 NOTE — Telephone Encounter (Signed)
Letter printed to be signed and mailed to address on file. Copy sent by MyChart.   Orders canceled.   Encounter closed.

## 2023-06-30 NOTE — Telephone Encounter (Signed)
Letter mailed

## 2023-07-12 ENCOUNTER — Ambulatory Visit: Payer: BC Managed Care – PPO | Admitting: Radiology

## 2023-07-16 ENCOUNTER — Telehealth: Payer: Self-pay | Admitting: Obstetrics and Gynecology

## 2023-07-16 NOTE — Telephone Encounter (Signed)
Please contact patient. She came up in my reminder box in Epic.   She is due for pelvic ultrasound and possible endometrial biopsy for postmenopausal bleeding.   I saw her in May and dx her with postmenopausal bleeding.  She did not keep her appointment on 04/07/23 for her ultrasound/biopsy.  I recommend proceeding with the evaluation.   She has an upcoming appointment for an annual exam with Jami on 07/25/23.

## 2023-07-16 NOTE — Telephone Encounter (Signed)
The upcoming appointment with Denise Byrd is for a problem visit, not an annual.

## 2023-07-18 NOTE — Telephone Encounter (Signed)
Please review telephone encounter dated 04/07/2023. Letter has been mailed re: this matter.   Please advise if anything additional is recommended. Thank you.

## 2023-07-18 NOTE — Telephone Encounter (Signed)
Thank you.  Nothing else is needed.  Encounter reviewed and closed.

## 2023-07-25 ENCOUNTER — Ambulatory Visit: Payer: BC Managed Care – PPO | Admitting: Radiology

## 2024-02-09 IMAGING — MG DIGITAL SCREENING BREAST BILAT IMPLANT W/ TOMO W/ CAD
8 of 14 series · 8 of 34 positions shown · non-contrast
Comparison: Previous exam(s).

CLINICAL DATA: Screening.

EXAM:
DIGITAL SCREENING BILATERAL MAMMOGRAM WITH IMPLANTS, CAD AND
TOMOSYNTHESIS
TECHNIQUE: Bilateral screening digital craniocaudal and mediolateral oblique
mammograms were obtained. Bilateral screening digital breast
tomosynthesis was performed. The images were evaluated with
computer-aided detection. Standard and/or implant displaced views
were performed.

[L CC]
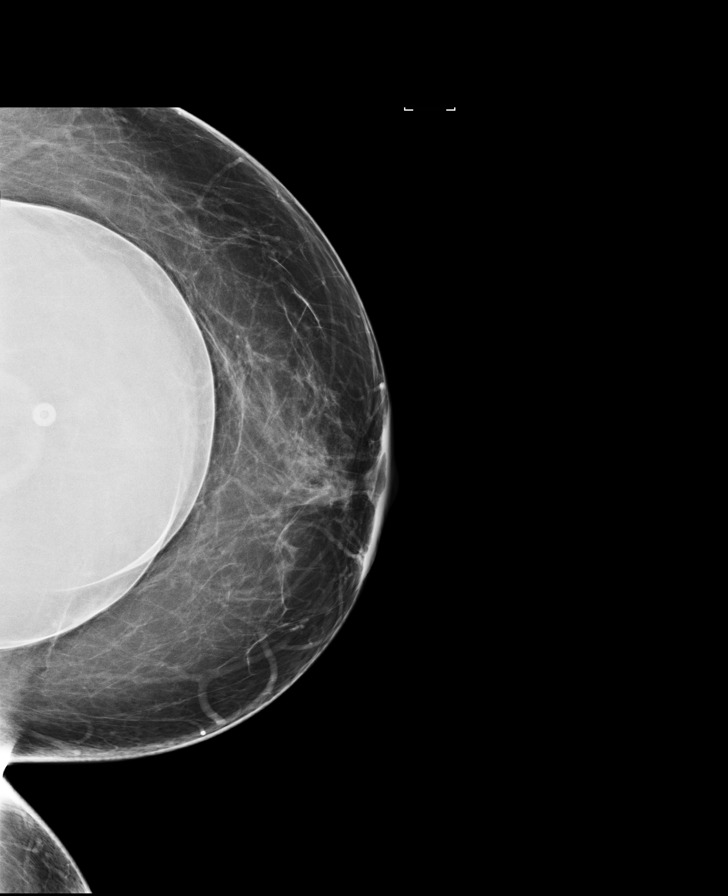

[R CC]
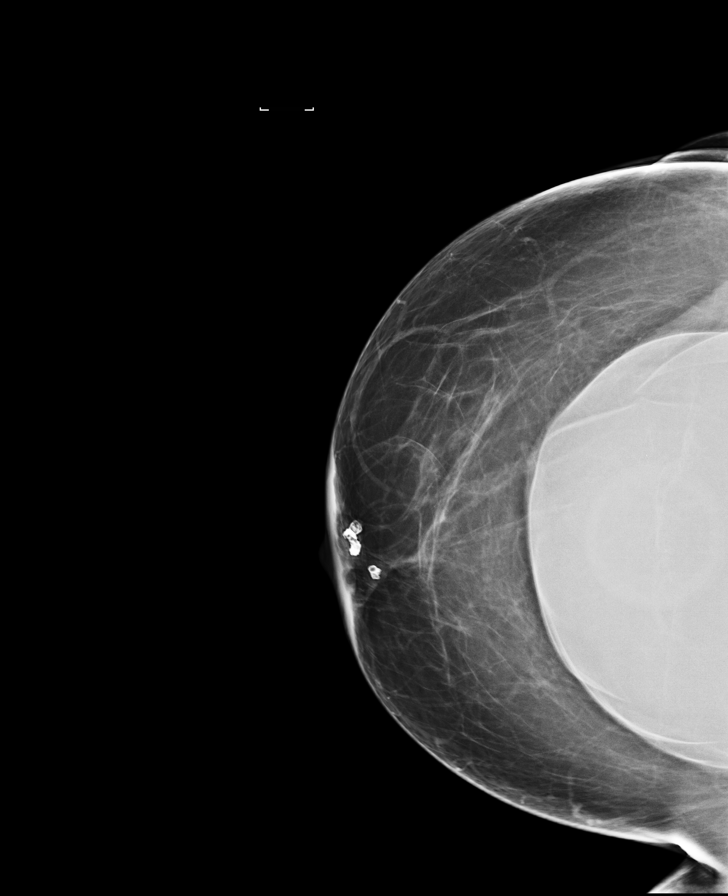

[L MLO]
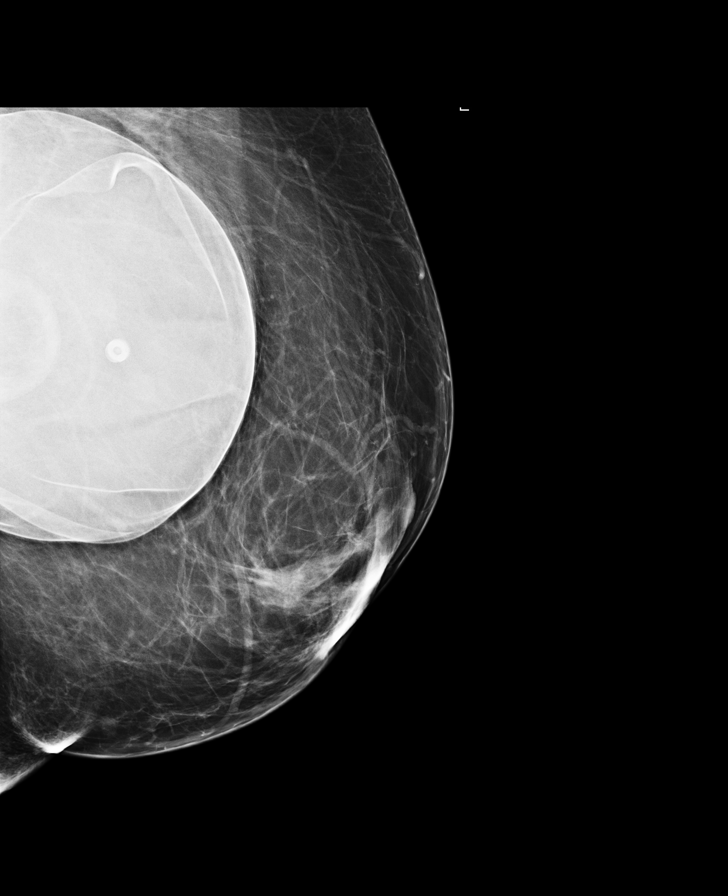

[R MLO]
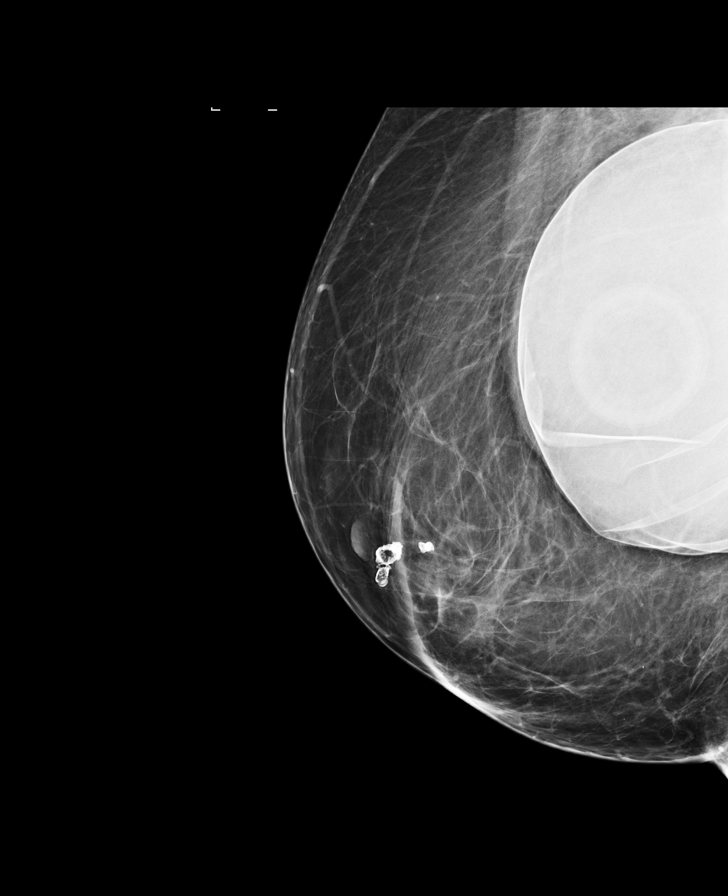

[R CC synth-2D]
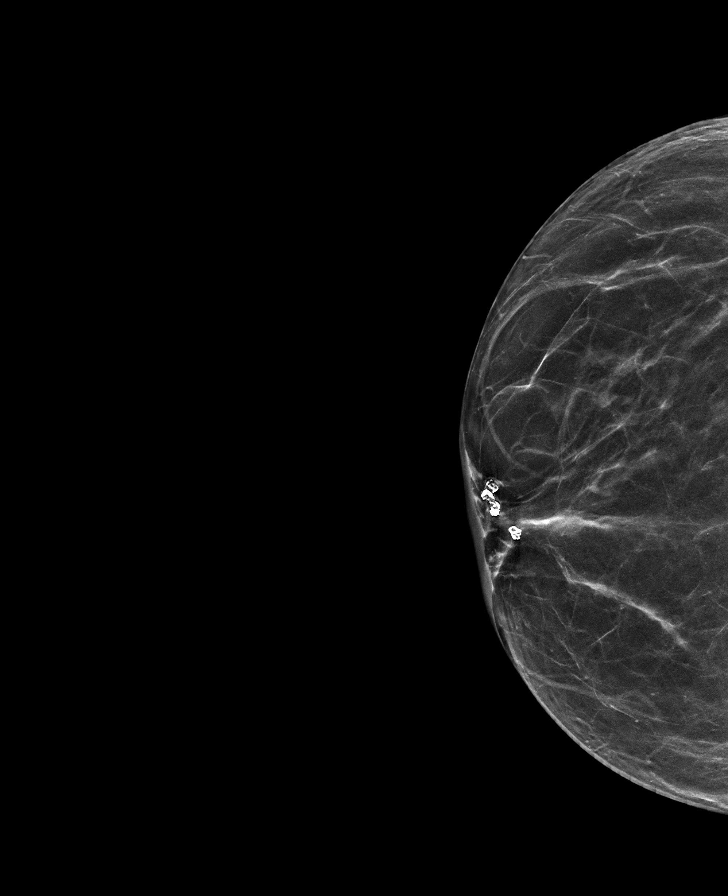

[L MLO synth-2D (1 of 2)]
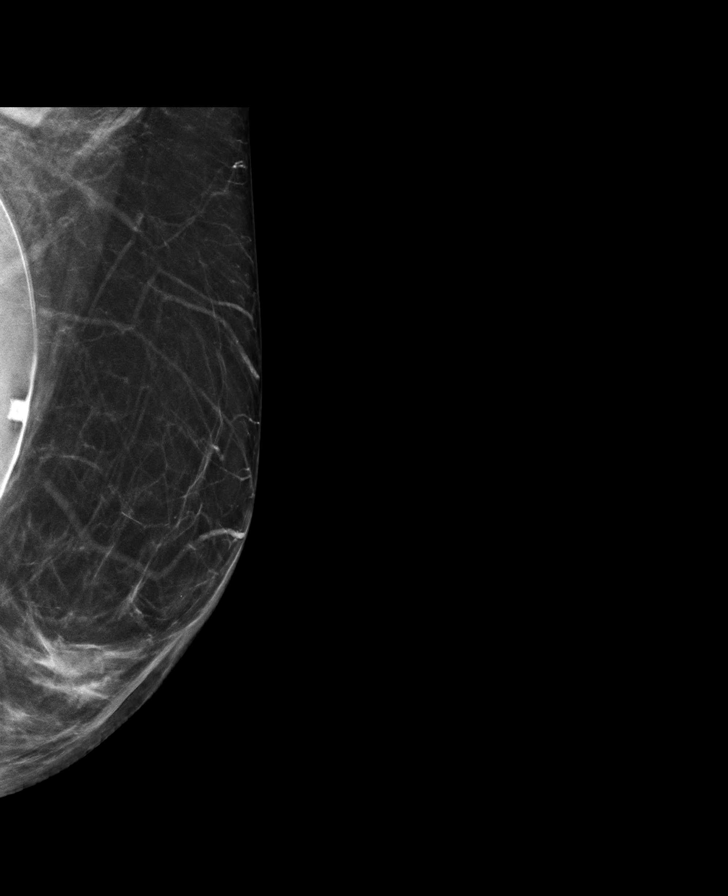

[L MLO synth-2D (2 of 2)]
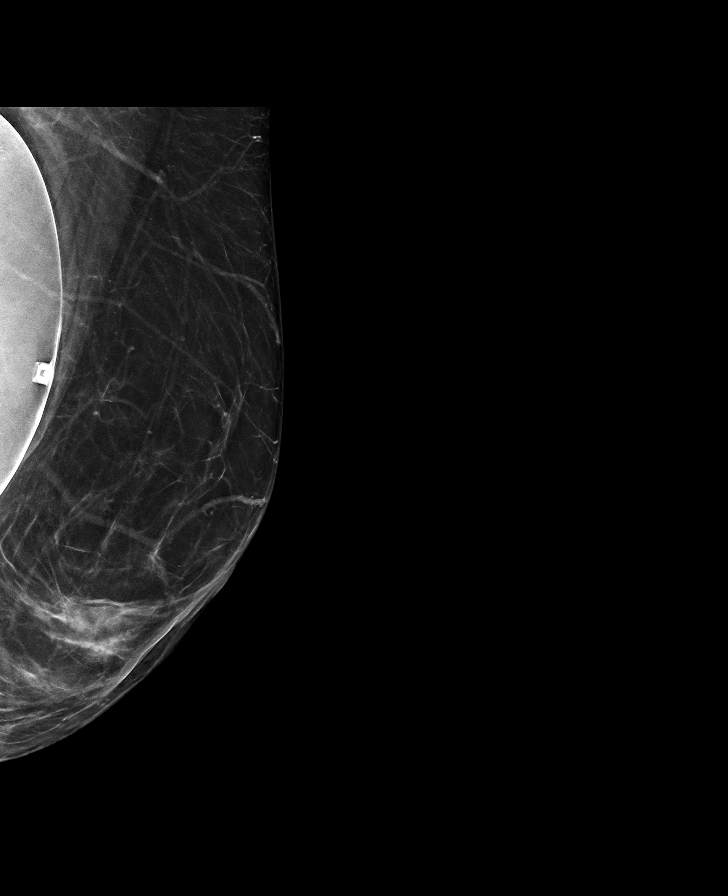

[L CC synth-2D]
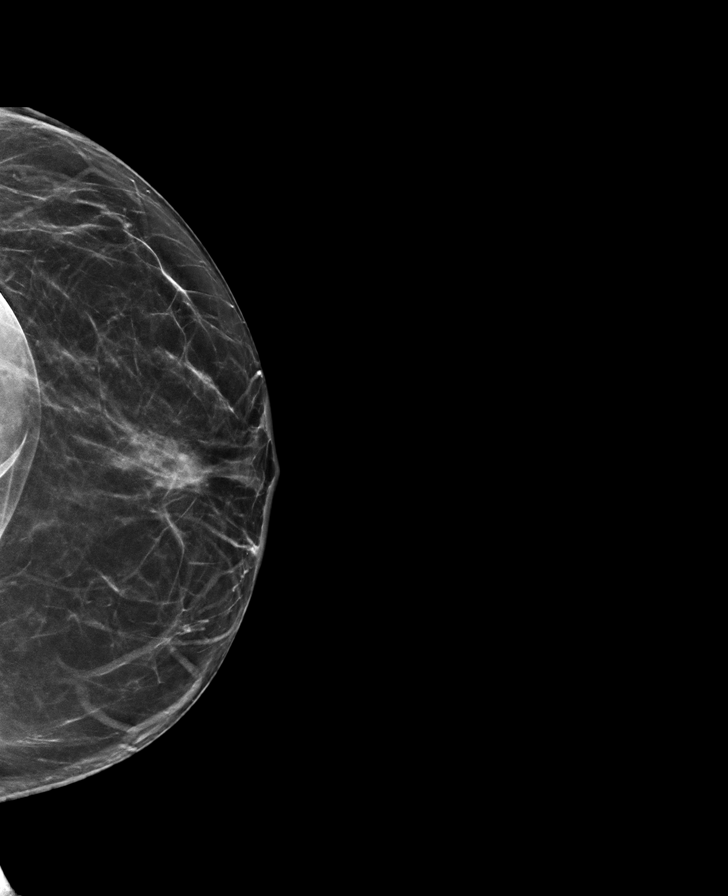

[8 of 34 positions shown; findings below may reference images not displayed]

ACR Breast Density Category b: There are scattered areas of
fibroglandular density.
FINDINGS: The patient has retropectoral saline implants.

In the left breast, a possible focal asymmetry warrants further
evaluation. In the right breast, no suspicious masses or malignant
type calcifications are identified.
IMPRESSION: Further evaluation is suggested for possible focal asymmetry in the
left breast.

RECOMMENDATION:
Diagnostic mammogram and possibly ultrasound of the left breast.
(Code:UK-M-DDM)

The patient will be contacted regarding the findings, and additional
imaging will be scheduled.

BI-RADS CATEGORY  0: Incomplete. Need additional imaging evaluation
and/or prior mammograms for comparison.

## 2024-07-05 ENCOUNTER — Other Ambulatory Visit: Payer: Self-pay | Admitting: Physician Assistant

## 2024-07-05 DIAGNOSIS — Z1231 Encounter for screening mammogram for malignant neoplasm of breast: Secondary | ICD-10-CM

## 2024-07-25 ENCOUNTER — Other Ambulatory Visit: Payer: Self-pay | Admitting: Physician Assistant

## 2024-07-25 ENCOUNTER — Ambulatory Visit
Admission: RE | Admit: 2024-07-25 | Discharge: 2024-07-25 | Disposition: A | Discharge status: Home / Self Care | Source: Ambulatory Visit | Attending: Physician Assistant | Admitting: Physician Assistant

## 2024-07-25 DIAGNOSIS — Z1231 Encounter for screening mammogram for malignant neoplasm of breast: Secondary | ICD-10-CM

## 2024-09-03 ENCOUNTER — Ambulatory Visit: Admitting: Radiology
